# Patient Record
Sex: Male | Born: 1960 | Race: White | Hispanic: No | Marital: Married | Smoking: Never smoker
Health system: Southern US, Community
[De-identification: ages and names within clinical notes are randomized; demographics above are authoritative.]

## PROBLEM LIST (undated history)

## (undated) DIAGNOSIS — M109 Gout, unspecified: Secondary | ICD-10-CM

## (undated) DIAGNOSIS — I10 Essential (primary) hypertension: Secondary | ICD-10-CM

## (undated) HISTORY — DX: Gout, unspecified: M10.9

## (undated) HISTORY — DX: Essential (primary) hypertension: I10

---
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
## 2004-03-14 ENCOUNTER — Other Ambulatory Visit: Payer: Self-pay

## 2004-03-14 ENCOUNTER — Emergency Department: Payer: Self-pay | Admitting: Emergency Medicine

## 2005-01-25 ENCOUNTER — Emergency Department: Payer: Self-pay | Admitting: Emergency Medicine

## 2006-03-15 HISTORY — PX: FOOT SURGERY: SHX648

## 2009-11-12 ENCOUNTER — Ambulatory Visit: Payer: Self-pay | Admitting: Internal Medicine

## 2010-07-10 ENCOUNTER — Ambulatory Visit: Payer: Self-pay | Admitting: Internal Medicine

## 2011-05-11 ENCOUNTER — Ambulatory Visit: Payer: Self-pay | Admitting: General Surgery

## 2014-05-13 ENCOUNTER — Emergency Department: Payer: Self-pay | Admitting: Emergency Medicine

## 2014-05-24 DIAGNOSIS — I10 Essential (primary) hypertension: Secondary | ICD-10-CM | POA: Insufficient documentation

## 2015-12-02 DIAGNOSIS — R739 Hyperglycemia, unspecified: Secondary | ICD-10-CM | POA: Insufficient documentation

## 2016-02-25 ENCOUNTER — Ambulatory Visit: Payer: Self-pay | Admitting: Physician Assistant

## 2016-02-25 ENCOUNTER — Encounter: Payer: Self-pay | Admitting: Physician Assistant

## 2016-02-25 VITALS — BP 160/90 | HR 115 | Temp 98.6°F

## 2016-02-25 DIAGNOSIS — J029 Acute pharyngitis, unspecified: Secondary | ICD-10-CM

## 2016-02-25 DIAGNOSIS — R300 Dysuria: Secondary | ICD-10-CM

## 2016-02-25 LAB — POCT URINALYSIS DIPSTICK
Bilirubin, UA: NEGATIVE
Glucose, UA: NEGATIVE
Ketones, UA: NEGATIVE
Leukocytes, UA: NEGATIVE
Nitrite, UA: NEGATIVE
Protein, UA: NEGATIVE
Spec Grav, UA: 1.02
Urobilinogen, UA: 0.2
pH, UA: 5.5

## 2016-02-25 LAB — POCT RAPID STREP A (OFFICE): Rapid Strep A Screen: NEGATIVE

## 2016-02-25 MED ORDER — AZITHROMYCIN 250 MG PO TABS: ORAL_TABLET | ORAL | 0 refills | Status: DC

## 2016-02-25 NOTE — Progress Notes (Signed)
S: C/o sore throat, runny nose and congestion for 3 days, ? fever, chills, felt really hot last night, none this am, also some burning with urination last night, no cp/sob, v/d;  Using otc meds:   O: PE: vitals wnl nad,  perrl eomi, normocephalic, tms dull, nasal mucosa red and swollen, throat injected with exudate on left side; neck supple no lymph, lungs c t a, cv rrr, neuro intact, q strep neg, ua wnl  A:  Acute pharyngitis, dysuria   P: drink fluids, continue regular meds , use otc meds of choice, return if not improving in 5 days, return earlier if worsening , tylenol 1000mg   given in clinic, zpack,

## 2016-05-19 ENCOUNTER — Ambulatory Visit: Payer: Self-pay | Admitting: Family

## 2016-05-19 VITALS — BP 150/110 | HR 100 | Temp 101.0°F

## 2016-05-19 DIAGNOSIS — J069 Acute upper respiratory infection, unspecified: Secondary | ICD-10-CM

## 2016-05-19 DIAGNOSIS — J101 Influenza due to other identified influenza virus with other respiratory manifestations: Secondary | ICD-10-CM

## 2016-05-19 LAB — POCT INFLUENZA A/B
INFLUENZA A, POC: NEGATIVE
Influenza B, POC: POSITIVE — AB

## 2016-05-19 MED ORDER — OSELTAMIVIR PHOSPHATE 75 MG PO CAPS: 75.0000 mg | ORAL_CAPSULE | Freq: Two times a day (BID) | ORAL | 0 refills | Status: DC

## 2016-05-19 MED ORDER — AZITHROMYCIN 250 MG PO TABS: ORAL_TABLET | ORAL | 0 refills | Status: DC

## 2016-05-19 MED ORDER — HYDROCOD POLST-CPM POLST ER 10-8 MG/5ML PO SUER: 5.0000 mL | Freq: Two times a day (BID) | ORAL | 0 refills | Status: DC | PRN

## 2016-05-19 NOTE — Progress Notes (Signed)
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 zpack  rx tussionex 1 tsp q 12 h prn #100 orf. Supportive measures discussed. Follow up prn not improving   Hydration encouraged , tylenol alt with advil .Out of work until 3/12 /18 note given.

## 2017-04-26 ENCOUNTER — Ambulatory Visit: Payer: Self-pay | Admitting: Family Medicine

## 2017-04-26 VITALS — BP 140/99 | HR 108 | Temp 98.3°F | Resp 17

## 2017-04-26 DIAGNOSIS — R05 Cough: Secondary | ICD-10-CM

## 2017-04-26 DIAGNOSIS — J069 Acute upper respiratory infection, unspecified: Secondary | ICD-10-CM

## 2017-04-26 DIAGNOSIS — I10 Essential (primary) hypertension: Secondary | ICD-10-CM

## 2017-04-26 DIAGNOSIS — R6889 Other general symptoms and signs: Secondary | ICD-10-CM

## 2017-04-26 DIAGNOSIS — R509 Fever, unspecified: Secondary | ICD-10-CM

## 2017-04-26 DIAGNOSIS — R059 Cough, unspecified: Secondary | ICD-10-CM

## 2017-04-26 LAB — POCT INFLUENZA A/B
INFLUENZA A, POC: NEGATIVE
Influenza B, POC: NEGATIVE

## 2017-04-26 MED ORDER — FLUTICASONE PROPIONATE 50 MCG/ACT NA SUSP: 2.0000 | Freq: Every day | NASAL | 1 refills | Status: DC

## 2017-04-26 MED ORDER — BENZONATATE 100 MG PO CAPS: 100.0000 mg | ORAL_CAPSULE | Freq: Three times a day (TID) | ORAL | 0 refills | Status: DC | PRN

## 2017-04-26 NOTE — Progress Notes (Signed)
Patient ID: Shawn RegisterDwayne M Mullan, male    DOB: 11/19/1960  Age: 57 y.o. MRN: [id number]  Chief Complaint  Patient presents with  . URI  . 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  He has been getting ill for the last couple of days.  Sunday he was only mildly congested.  Yesterday got worse with more congestion, sinus and head pressure, and then developed a cough.  When you breathe the.  Cough.  When the evening came he developed some backache and leg aches.  He has had his influenza vaccine.  He does not smoke.  He generally is pretty healthy, on blood pressure medicine.  Usually his blood pressure is in good control.  Temperature just was just over 100 last night.  Current allergies, medications, problem list, past/family and social histories reviewed.  Objective:  BP (!) 140/99   Pulse (!) 108   Temp 98.3 F (36.8 C) (Oral)   Resp 17   SpO2 96%   Does not appear terribly ill though he is coughing some.  His TMs are normal.  Eyes not injected.  Throat minimally erythematous.  He does have some sore throat complaints.  Nose a little congested.  Chest is clear.  Heart regular without murmurs.  When he takes a deep breath does trigger some coughing.  His neck is supple without nodes.  Assessment & Plan:   Assessment: 1. Viral upper respiratory infection   2. Fever, unspecified   3. Flu-like symptoms   4. Essential hypertension   5. Cough       Plan: Influenza test is negative.  Will treat symptomatically.  Orders Placed This Encounter  Procedures  . POCT Influenza A/B (POC66)    No orders of the defined types were placed in this encounter.        Patient Instructions  Drink plenty of fluids and get enough rest  Advise not going to work until afebrile for 24 hours.  Take Tylenol 500 mg 2 pills 3 times daily and/or ibuprofen 200 mg 3 pills 3 times daily as needed for fever or aching.  Use fluticasone nose spray 2 sprays  each nostril twice daily for 3 days, then once daily to open up sinuses.  You can continue using your Nettie pot if necessary.  You can take Claritin or Allegra or Zyrtec (loratadine or fexofenadine or cetirizine) in order to try to dry up the secretions some.  Because of your elevated blood pressure I would discourage taking things with a decongestant in them and less you have check your blood pressures and they are back to running well.  Take the NyQuil or another over-the-counter cough medicine such as Mucinex DM or Robitussin-DM if needed for cough.  The DM is a little bit sedating, and you work.  Benzonatate cough pills can be taken 1 or 2 pills 3 times daily as needed for daytime cough when you need something that is nonsedating.  Antibiotics are not indicated at this time, as they will do nothing for a virus infection.  It needs to run its course.  If however it transitions from this to something worse you might need to be rechecked.  Sometimes you can get secondary bacterial infections.   Upper Respiratory Infection, Adult Most upper respiratory infections (URIs) are caused by a virus. A URI affects the nose, throat, and upper air passages. The most common type of URI is often called "the common  cold." Follow these instructions at home:  Take medicines only as told by your doctor.  Gargle warm saltwater or take cough drops to comfort your throat as told by your doctor.  Use a warm mist humidifier or inhale steam from a shower to increase air moisture. This may make it easier to breathe.  Drink enough fluid to keep your pee (urine) clear or pale yellow.  Eat soups and other clear broths.  Have a healthy diet.  Rest as needed.  Go back to work when your fever is gone or your doctor says it is okay. ? You may need to stay home longer to avoid giving your URI to others. ? You can also wear a face mask and wash your hands often to prevent spread of the virus.  Use your inhaler  more if you have asthma.  Do not use any tobacco products, including cigarettes, chewing tobacco, or electronic cigarettes. If you need help quitting, ask your doctor. Contact a doctor if:  You are getting worse, not better.  Your symptoms are not helped by medicine.  You have chills.  You are getting more short of breath.  You have brown or red mucus.  You have yellow or brown discharge from your nose.  You have pain in your face, especially when you bend forward.  You have a fever.  You have puffy (swollen) neck glands.  You have pain while swallowing.  You have white areas in the back of your throat. Get help right away if:  You have very bad or constant: ? Headache. ? Ear pain. ? Pain in your forehead, behind your eyes, and over your cheekbones (sinus pain). ? Chest pain.  You have long-lasting (chronic) lung disease and any of the following: ? Wheezing. ? Long-lasting cough. ? Coughing up blood. ? A change in your usual mucus.  You have a stiff neck.  You have changes in your: ? Vision. ? Hearing. ? Thinking. ? Mood. This information is not intended to replace advice given to you by your health care provider. Make sure you discuss any questions you have with your health care provider. Document Released: 08/18/2007 Document Revised: 11/02/2015 Document Reviewed: 06/06/2013 Elsevier Interactive Patient Education  [employer].      Return if symptoms worsen or fail to improve.   Ellery Tash, MD 04/26/2017

## 2017-04-26 NOTE — Patient Instructions (Addendum)
Drink plenty of fluids and get enough rest  Advise not going to work until afebrile for 24 hours.  Take Tylenol 500 mg 2 pills 3 times daily and/or ibuprofen 200 mg 3 pills 3 times daily as needed for fever or aching.  Use fluticasone nose spray 2 sprays each nostril twice daily for 3 days, then once daily to open up sinuses.  You can continue using your Nettie pot if necessary.  You can take Claritin or Allegra or Zyrtec (loratadine or fexofenadine or cetirizine) in order to try to dry up the secretions some.  Because of your elevated blood pressure I would discourage taking things with a decongestant in them and less you have check your blood pressures and they are back to running well.  Take the NyQuil or another over-the-counter cough medicine such as Mucinex DM or Robitussin-DM if needed for cough.  The DM is a little bit sedating, and you work.  Benzonatate cough pills can be taken 1 or 2 pills 3 times daily as needed for daytime cough when you need something that is nonsedating.  Antibiotics are not indicated at this time, as they will do nothing for a virus infection.  It needs to run its course.  If however it transitions from this to something worse you might need to be rechecked.  Sometimes you can get secondary bacterial infections.   Upper Respiratory Infection, Adult Most upper respiratory infections (URIs) are caused by a virus. A URI affects the nose, throat, and upper air passages. The most common type of URI is often called "the common cold." Follow these instructions at home:  Take medicines only as told by your doctor.  Gargle warm saltwater or take cough drops to comfort your throat as told by your doctor.  Use a warm mist humidifier or inhale steam from a shower to increase air moisture. This may make it easier to breathe.  Drink enough fluid to keep your pee (urine) clear or pale yellow.  Eat soups and other clear broths.  Have a healthy diet.  Rest as  needed.  Go back to work when your fever is gone or your doctor says it is okay. ? You may need to stay home longer to avoid giving your URI to others. ? You can also wear a face mask and wash your hands often to prevent spread of the virus.  Use your inhaler more if you have asthma.  Do not use any tobacco products, including cigarettes, chewing tobacco, or electronic cigarettes. If you need help quitting, ask your doctor. Contact a doctor if:  You are getting worse, not better.  Your symptoms are not helped by medicine.  You have chills.  You are getting more short of breath.  You have brown or red mucus.  You have yellow or brown discharge from your nose.  You have pain in your face, especially when you bend forward.  You have a fever.  You have puffy (swollen) neck glands.  You have pain while swallowing.  You have white areas in the back of your throat. Get help right away if:  You have very bad or constant: ? Headache. ? Ear pain. ? Pain in your forehead, behind your eyes, and over your cheekbones (sinus pain). ? Chest pain.  You have long-lasting (chronic) lung disease and any of the following: ? Wheezing. ? Long-lasting cough. ? Coughing up blood. ? A change in your usual mucus.  You have a stiff neck.  You have changes  in your: ? Vision. ? Hearing. ? Thinking. ? Mood. This information is not intended to replace advice given to you by your health care provider. Make sure you discuss any questions you have with your health care provider. Document Released: 08/18/2007 Document Revised: 11/02/2015 Document Reviewed: 06/06/2013 Elsevier Interactive Patient Education  2018 [employer].

## 2018-01-23 ENCOUNTER — Ambulatory Visit (INDEPENDENT_AMBULATORY_CARE_PROVIDER_SITE_OTHER): Payer: Managed Care, Other (non HMO) | Admitting: Urology

## 2018-01-23 ENCOUNTER — Encounter: Payer: Self-pay | Admitting: Urology

## 2018-01-23 VITALS — BP 154/100 | HR 81 | Ht 72.0 in | Wt 248.5 lb

## 2018-01-23 DIAGNOSIS — R972 Elevated prostate specific antigen [PSA]: Secondary | ICD-10-CM | POA: Diagnosis not present

## 2018-01-23 DIAGNOSIS — R300 Dysuria: Secondary | ICD-10-CM

## 2018-01-23 LAB — URINALYSIS, COMPLETE
Bilirubin, UA: NEGATIVE
GLUCOSE, UA: NEGATIVE
KETONES UA: NEGATIVE
LEUKOCYTES UA: NEGATIVE
NITRITE UA: NEGATIVE
Protein, UA: NEGATIVE
RBC UA: NEGATIVE
SPEC GRAV UA: 1.015 (ref 1.005–1.030)
UUROB: 0.2 mg/dL (ref 0.2–1.0)
pH, UA: 6.5 (ref 5.0–7.5)

## 2018-01-23 MED ORDER — TAMSULOSIN HCL 0.4 MG PO CAPS: 0.4000 mg | ORAL_CAPSULE | Freq: Every day | ORAL | 11 refills | Status: DC

## 2018-01-23 MED ORDER — SULFAMETHOXAZOLE-TRIMETHOPRIM 800-160 MG PO TABS: 1.0000 | ORAL_TABLET | Freq: Two times a day (BID) | ORAL | 0 refills | Status: DC

## 2018-01-23 NOTE — Progress Notes (Signed)
01/23/2018 4:46 PM   Shown Laqueta Due 1960/08/14 [id number]  Referring provider: Kandyce Rud, MD [phone] [address] [facility] [place], [place] [address]  CC: Elevated PSA, pelvic pain, dysuria  HPI: I had the pleasure of meeting Shawn Henry in urology clinic today in consultation for elevated PSA, pelvic pain, and dysuria from Dr. Larwance Sachs.  He is a very healthy 57 year old male with a family history of non-lethal prostate cancer in his grandfather, as well as fatal ovarian cancer at a young age and his sister who presents with an elevated PSA of 4.58.  PSA was 3.11-year previously.  He has never had a prostate biopsy before.  He also has had notable urinary symptoms over the last 4 to 6 weeks.  He reports some dysuria and weak stream, as well as pelvic pain and pressure.  PVR in clinic today was 118 cc.  He feels like his symptoms are actually worse when he is laying down at the end of the day.  There are no other aggravating factors.  He was prescribed a course of nitrofurantoin by his primary care physician that did seem to improve his symptoms significantly, however it has since returned.  He does have a history of prostatitis in the past.  He denies any bone pain or weight loss.  There is no prior urine culture data available to me.  He denies any gross hematuria or flank pain.   PMH: Past Medical History:  Diagnosis Date  . Gout   . HTN (hypertension)     Surgical History: Past Surgical History:  Procedure Laterality Date  . FOOT SURGERY  2008  . KNEE SURGERY  1980    Allergies:  Allergies  Allergen Reactions  . Penicillin G Potassium In D5w Rash    Family History: Family History  Problem Relation Age of Onset  . Prostate cancer Paternal Grandfather   . Kidney cancer Neg Hx   . Kidney failure Neg Hx   . Sickle cell anemia Neg Hx   . Tuberculosis Neg Hx     Social History:  reports that he has never smoked. He has never used  smokeless tobacco. His alcohol and drug histories are not on file.  ROS: Please see flowsheet from today's date for complete review of systems.  Physical Exam: BP (!) 154/100 (BP Location: Left Arm, Patient Position: Sitting, Cuff Size: Large)   Pulse 81   Ht 6' (1.829 m)   Wt 248 lb 8 oz (112.7 kg)   BMI 33.70 kg/m    Constitutional:  Alert and oriented, No acute distress. Cardiovascular: No clubbing, cyanosis, or edema. Respiratory: Normal respiratory effort, no increased work of breathing. GI: Abdomen is soft, nontender, nondistended, no abdominal masses GU: No CVA tenderness, phallus without lesions, widely patent meatus DRE: 20 g, smooth prostate, no nodules or masses Lymph: No cervical or inguinal lymphadenopathy. Skin: No rashes, bruises or suspicious lesions. Neurologic: Grossly intact, no focal deficits, moving all 4 extremities. Psychiatric: Normal mood and affect.  Laboratory Data: PSA history 12/08/2017: 4.58 11/2016: 3.07 11/2015: 2.56 05/2014: 2.28   Urinalysis today 0 WBCs, 0 RBCs, no bacteria, nitrite negative  Pertinent Imaging: None to review  Assessment & Plan:   In summary, Shawn Henry is a healthy 56 year old male with a family history of nonlethal prostate cancer in his grandfather who presents with mildly elevated PSA to 4.58 in conjunction with significant urinary symptoms and pelvic pain.  Though urinalysis is not concerning  today, he has been and on antibiotics recently.  Unclear if etiology of his mildly elevated PSA and symptoms as possible prostatitis, or if these are unrelated.  We reviewed the implications of an elevated PSA and the uncertainty surrounding it. In general, a man's PSA increases with age and is produced by both normal and cancerous prostate tissue. The differential diagnosis for elevated PSA includes BPH, prostate cancer, infection, recent intercourse/ejaculation, recent urethroscopic manipulation (foley placement/cystoscopy) or trauma,  and prostatitis.   Management of an elevated PSA can include observation or prostate biopsy and we discussed this in detail. Our goal is to detect clinically significant prostate cancers, and manage with either active surveillance, surgery, or radiation for localized disease. Risks of prostate biopsy include bleeding, infection (including life threatening sepsis), pain, and lower urinary symptoms. Hematuria, hematospermia, and blood in the stool are all common after biopsy and can persist up to 4 weeks.   Since his urinary symptoms are significant, I did recommend treatment for possible prostatitis and repeat PSA prior to undergoing a biopsy.  He will take a 2-week course of treatment Bactrim, as well as a week of anti-inflammatories for his pelvic pain, and follow-up in 4 weeks with a repeat PSA.  If PSA remains elevated, I do recommend proceeding with prostate biopsy at that time.   Sondra Come, MD  [facility] [address] [place], [place] [address] [phone]

## 2018-02-16 ENCOUNTER — Other Ambulatory Visit: Payer: Managed Care, Other (non HMO)

## 2018-02-16 DIAGNOSIS — R300 Dysuria: Secondary | ICD-10-CM

## 2018-02-17 LAB — PSA, TOTAL AND FREE
PROSTATE SPECIFIC AG, SERUM: 3.2 ng/mL (ref 0.0–4.0)
PSA, Free Pct: 16.6 %
PSA, Free: 0.53 ng/mL

## 2018-02-21 ENCOUNTER — Ambulatory Visit (INDEPENDENT_AMBULATORY_CARE_PROVIDER_SITE_OTHER): Payer: Managed Care, Other (non HMO) | Admitting: Urology

## 2018-02-21 ENCOUNTER — Encounter: Payer: Self-pay | Admitting: Urology

## 2018-02-21 VITALS — BP 151/89 | HR 87 | Ht 72.0 in | Wt 252.3 lb

## 2018-02-21 DIAGNOSIS — R300 Dysuria: Secondary | ICD-10-CM

## 2018-02-21 DIAGNOSIS — Z125 Encounter for screening for malignant neoplasm of prostate: Secondary | ICD-10-CM

## 2018-02-21 NOTE — Progress Notes (Signed)
   02/21/2018 4:36 PM   Izaiyah Laqueta DueM Rideaux 12/09/1960 [id number]  Reason for visit: Follow up elevated PSA and pelvic pain/prostatitis  I saw Mr. Ninetta LightsJaney back in urology clinic for discussion of his prior elevated PSA and pelvic pain/prostatitis.  He is a 57 year old healthy man I originally saw 01/23/2018 for an elevated PSA of 4.58.  He has a family history of nonlethal prostate cancer in his grandfather.  At the time of his elevated PSA he was having dysuria and some low pelvic pain behind the scrotum that he attributed to possible prostatitis.  He had taken a short course of nitrofurantoin from his PCP that improved his symptoms temporarily.  There were no documented positive cultures in the chart.  At that time we had elected to treat presumed prostatitis with a prolonged course of Bactrim, as well as 2 weeks of scheduled anti-inflammatories for his pelvic pain.  His repeat PSA was 3.2 which is stable from 3.1 in 2018.  He does feel that the course of antibiotics and anti-inflammatories have significantly improved his dysuria and urinary symptoms, as well as his pelvic pain.  I suspect he had a smoldering prostatitis that causes elevated PSA and dysuria and pelvic symptoms, now resolved after worse of antibiotics and anti-inflammatories.  We discussed the AUA guidelines that recommend PSA screening for men up to age 770.  RTC in 1 year for PSA/DRE, sooner if recurrent urinary/pelvic symptoms  A total of 15 minutes were spent face-to-face with the patient, greater than 50% was spent in patient education, counseling, and coordination of care regarding PSA screening, prostatitis, and dysuria.   Sondra ComeBrian C Sninsky, MD  [facility] [address], [address] [place], [place] [address] [phone]

## 2018-02-22 LAB — URINALYSIS, COMPLETE
Bilirubin, UA: NEGATIVE
Glucose, UA: NEGATIVE
Ketones, UA: NEGATIVE
Nitrite, UA: NEGATIVE
Protein, UA: NEGATIVE
RBC, UA: NEGATIVE
Specific Gravity, UA: 1.015 (ref 1.005–1.030)
Urobilinogen, Ur: 0.2 mg/dL (ref 0.2–1.0)
pH, UA: 7.5 (ref 5.0–7.5)

## 2018-02-22 LAB — MICROSCOPIC EXAMINATION
Bacteria, UA: NONE SEEN
Epithelial Cells (non renal): NONE SEEN /HPF (ref 0–10)
RBC, UA: NONE SEEN /HPF (ref 0–2)

## 2018-08-27 ENCOUNTER — Emergency Department: Payer: Managed Care, Other (non HMO)

## 2018-08-27 ENCOUNTER — Other Ambulatory Visit: Payer: Self-pay

## 2018-08-27 DIAGNOSIS — K5900 Constipation, unspecified: Secondary | ICD-10-CM | POA: Insufficient documentation

## 2018-08-27 DIAGNOSIS — Z79899 Other long term (current) drug therapy: Secondary | ICD-10-CM | POA: Diagnosis not present

## 2018-08-27 DIAGNOSIS — I10 Essential (primary) hypertension: Secondary | ICD-10-CM | POA: Diagnosis not present

## 2018-08-27 DIAGNOSIS — R1013 Epigastric pain: Secondary | ICD-10-CM | POA: Diagnosis present

## 2018-08-27 DIAGNOSIS — Z7982 Long term (current) use of aspirin: Secondary | ICD-10-CM | POA: Diagnosis not present

## 2018-08-27 LAB — TROPONIN I: Troponin I: <0.03 ng/mL (ref <0.03)

## 2018-08-27 LAB — COMPREHENSIVE METABOLIC PANEL
ALT: 28 U/L (ref 0–44)
AST: 20 U/L (ref 15–41)
Albumin: 4.5 g/dL (ref 3.5–5.0)
Alkaline Phosphatase: 61 U/L (ref 38–126)
Anion gap: 11 (ref 5–15)
BUN: 14 mg/dL (ref 6–20)
CO2: 28 mmol/L (ref 22–32)
Calcium: 10 mg/dL (ref 8.9–10.3)
Chloride: 102 mmol/L (ref 98–111)
Creatinine, Ser: 1.04 mg/dL (ref 0.61–1.24)
GFR calc Af Amer: >60 mL/min (ref >60)
GFR calc non Af Amer: >60 mL/min (ref >60)
Glucose, Bld: 123 mg/dL — ABNORMAL HIGH (ref 70–99)
Potassium: 3.7 mmol/L (ref 3.5–5.1)
Sodium: 141 mmol/L (ref 135–145)
Total Bilirubin: 0.4 mg/dL (ref 0.3–1.2)
Total Protein: 7.3 g/dL (ref 6.5–8.1)

## 2018-08-27 LAB — CBC
HCT: 46.3 % (ref 39.0–52.0)
Hemoglobin: 15.2 g/dL (ref 13.0–17.0)
MCH: 27.1 pg (ref 26.0–34.0)
MCHC: 32.8 g/dL (ref 30.0–36.0)
MCV: 82.7 fL (ref 80.0–100.0)
Platelets: 271 10*3/uL (ref 150–400)
RBC: 5.6 MIL/uL (ref 4.22–5.81)
RDW: 13.3 % (ref 11.5–15.5)
WBC: 9.2 10*3/uL (ref 4.0–10.5)
nRBC: 0 % (ref 0.0–0.2)

## 2018-08-27 LAB — LIPASE, BLOOD: Lipase: 35 U/L (ref 11–51)

## 2018-08-27 NOTE — ED Triage Notes (Signed)
Pt with central chest to epigastric pain since 1700. Pt states has had some back pain, but denies nausea, vomiting, shob, diaphoresis, lightheadedness or dizziness. Pt appears in no acute distress.

## 2018-08-28 ENCOUNTER — Emergency Department: Payer: Managed Care, Other (non HMO)

## 2018-08-28 ENCOUNTER — Emergency Department
Admission: EM | Admit: 2018-08-28 | Discharge: 2018-08-28 | Disposition: A | Discharge status: Home / Self Care | Payer: Managed Care, Other (non HMO) | Attending: Emergency Medicine | Admitting: Emergency Medicine

## 2018-08-28 DIAGNOSIS — R1013 Epigastric pain: Secondary | ICD-10-CM

## 2018-08-28 DIAGNOSIS — K5900 Constipation, unspecified: Secondary | ICD-10-CM

## 2018-08-28 LAB — TROPONIN I: Troponin I: <0.03 ng/mL (ref <0.03)

## 2018-08-28 LAB — URINALYSIS, COMPLETE (UACMP) WITH MICROSCOPIC
Bilirubin Urine: NEGATIVE
Glucose, UA: NEGATIVE mg/dL
Hgb urine dipstick: NEGATIVE
Ketones, ur: NEGATIVE mg/dL
Leukocytes,Ua: NEGATIVE
Nitrite: NEGATIVE
Protein, ur: NEGATIVE mg/dL
Specific Gravity, Urine: 1.019 (ref 1.005–1.030)
Squamous Epithelial / HPF: NONE SEEN (ref 0–5)
pH: 5 (ref 5.0–8.0)

## 2018-08-28 MED ORDER — LACTULOSE 10 GM/15ML PO SOLN
30.0000 g | Freq: Once | ORAL | Status: AC
  Administered 2018-08-28: 30 g via ORAL
  Filled 2018-08-28: qty 60

## 2018-08-28 MED ORDER — SODIUM CHLORIDE 0.9 % IV BOLUS
500.0000 mL | Freq: Once | INTRAVENOUS | Status: AC
  Administered 2018-08-28: 500 mL via INTRAVENOUS

## 2018-08-28 MED ORDER — ONDANSETRON HCL 4 MG/2ML IJ SOLN
4.0000 mg | Freq: Once | INTRAMUSCULAR | Status: AC
  Administered 2018-08-28: 4 mg via INTRAVENOUS
  Filled 2018-08-28: qty 2

## 2018-08-28 MED ORDER — LACTULOSE 10 GM/15ML PO SOLN: 20.0000 g | Freq: Every day | ORAL | 0 refills | Status: DC | PRN

## 2018-08-28 MED ORDER — DICYCLOMINE HCL 20 MG PO TABS: 20.0000 mg | ORAL_TABLET | Freq: Four times a day (QID) | ORAL | 0 refills | Status: DC | PRN

## 2018-08-28 MED ORDER — FAMOTIDINE IN NACL 20-0.9 MG/50ML-% IV SOLN
20.0000 mg | Freq: Once | INTRAVENOUS | Status: AC
  Administered 2018-08-28: 20 mg via INTRAVENOUS
  Filled 2018-08-28: qty 50

## 2018-08-28 MED ORDER — FENTANYL CITRATE (PF) 100 MCG/2ML IJ SOLN
50.0000 ug | Freq: Once | INTRAMUSCULAR | Status: AC
  Administered 2018-08-28: 50 ug via INTRAVENOUS
  Filled 2018-08-28: qty 2

## 2018-08-28 NOTE — ED Notes (Signed)
Peripheral IV discontinued. Catheter intact. No signs of infiltration or redness. Gauze applied to IV site.   Discharge instructions reviewed with patient. Questions fielded by this RN. Patient verbalizes understanding of instructions. Patient discharged home in stable condition per sung. No acute distress noted at time of discharge.    

## 2018-08-28 NOTE — Discharge Instructions (Addendum)
1.  You may take Lactulose as needed for bowel movements. 2.  You may take Bentyl as needed for abdominal discomfort. 3.  Take the following over-the-counter medicines to regulate bowel movements daily: MiraLAX Stool softener such as Colace Fiber 4.  Drink plenty of fluids daily. 5.  Return to the ER for worsening symptoms, persistent vomiting, difficulty breathing or other concerns.

## 2018-08-28 NOTE — ED Provider Notes (Signed)
Shawn Henry   ____________________________________________   First MD Initiated Contact with Patient 08/28/18 0201     (approximate)  I have reviewed the triage vital signs and the nursing notes.   HISTORY  Chief Complaint Chest Pain    HPI Shawn Henry is a 58 y.o. male who presents to the ED from home with a chief complaint of epigastric/lower chest pain.  Patient reports onset of pressure type pain since 5 PM.  Ate steak and baked potatoes for lunch prior to onset of pain.  Endorses some radiation to his back.  Denies associated nausea or vomiting.  Denies recent fever, cough, shortness of breath, dysuria or diarrhea.  Denies recent travel, trauma or exposure to persons diagnosed with coronavirus.       Past Medical History:  Diagnosis Date  . Gout   . HTN (hypertension)     Patient Active Problem List   Diagnosis Date Noted  . Elevated blood sugar 12/02/2015  . Benign essential hypertension 05/24/2014    Past Surgical History:  Procedure Laterality Date  . FOOT SURGERY  2008  . [place]    Prior to Admission medications   Medication Sig Start Date End Date Taking? Authorizing Provider  aspirin EC 81 MG tablet Take 81 mg by mouth daily.    [provider]  dicyclomine (BENTYL) 20 MG tablet Take 1 tablet (20 mg total) by mouth every 6 (six) hours as needed. 08/28/18   Paulette Blanch, MD  fluticasone (FLONASE) 50 MCG/ACT nasal spray Place 2 sprays into both nostrils daily. 04/26/17   Posey Boyer, MD  lactulose (CHRONULAC) 10 GM/15ML solution Take 30 mLs (20 g total) by mouth daily as needed for mild constipation. 08/28/18   Paulette Blanch, MD  losartan-hydrochlorothiazide (HYZAAR) 50-12.5 MG tablet TAKE 1 TABLET BY MOUTH ONCE DAILY. 11/18/15   [provider]  Multiple Vitamin (MULTI-VITAMINS) TABS Take by mouth.    [provider]  sulfamethoxazole-trimethoprim (BACTRIM  DS,SEPTRA DS) 800-160 MG tablet Take 1 tablet by mouth 2 (two) times daily. 01/23/18   Billey Co, MD  tamsulosin (FLOMAX) 0.4 MG CAPS capsule Take 1 capsule (0.4 mg total) by mouth daily. Patient not taking: Reported on 02/21/2018 01/23/18   Billey Co, MD    Allergies Penicillin g potassium in d5w  Family History  Problem Relation Age of Onset  . Prostate cancer Paternal Grandfather   . Kidney cancer Neg Hx   . Kidney failure Neg Hx   . Sickle cell anemia Neg Hx   . Tuberculosis Neg Hx     Social History Social History   Tobacco Use  . Smoking status: Never Smoker  . Smokeless tobacco: Never Used  Substance Use Topics  . Alcohol use: Not on file  . Drug use: Not on file    Review of Systems  Constitutional: No fever/chills Eyes: No visual changes. ENT: No sore throat. Cardiovascular: Denies chest pain. Respiratory: Denies shortness of breath. Gastrointestinal: No abdominal pain.  No nausea, no vomiting.  No diarrhea.  No constipation. Genitourinary: Negative for dysuria. Musculoskeletal: Negative for back pain. Skin: Negative for rash. Neurological: Negative for headaches, focal weakness or numbness.   ____________________________________________   PHYSICAL EXAM:  VITAL SIGNS: ED Triage Vitals  Enc Vitals Group     BP 08/27/18 2222 (!) 180/102     Pulse Rate 08/27/18 2222 78     Resp 08/27/18 2222 18  Temp 08/27/18 2222 98 F (36.7 C)     Temp Source 08/27/18 2222 Oral     SpO2 --      Weight 08/27/18 2223 250 lb (113.4 kg)     Height 08/27/18 2223 6' (1.829 m)     Head Circumference --      Peak Flow --      Pain Score 08/27/18 2223 5     Pain Loc --      Pain Edu? --      Excl. in GC? --     Constitutional: Alert and oriented. Well appearing and in no acute distress. Eyes: Conjunctivae are normal. PERRL. EOMI. Head: Atraumatic. Nose: No congestion/rhinnorhea. Mouth/Throat: Mucous membranes are moist.  Oropharynx  non-erythematous. Neck: No stridor.   Cardiovascular: Normal rate, regular rhythm. Grossly normal heart sounds.  Good peripheral circulation. Respiratory: Normal respiratory effort.  No retractions. Lungs CTAB. Gastrointestinal: Soft and mildly tender to palpation epigastrium without rebound or guarding. No distention. No abdominal bruits. No CVA tenderness. Musculoskeletal: No lower extremity tenderness nor edema.  No joint effusions. Neurologic:  Normal speech and language. No gross focal neurologic deficits are appreciated. No gait instability. Skin:  Skin is warm, dry and intact. No rash noted. Psychiatric: Mood and affect are normal. Speech and behavior are normal.  ____________________________________________   LABS (all labs ordered are listed, but only abnormal results are displayed)  Labs Reviewed  COMPREHENSIVE METABOLIC PANEL - Abnormal; Notable for the following components:      Result Value   Glucose, Bld 123 (*)    All other components within normal limits  URINALYSIS, COMPLETE (UACMP) WITH MICROSCOPIC - Abnormal; Notable for the following components:   Color, Urine YELLOW (*)    APPearance CLEAR (*)    Bacteria, UA RARE (*)    All other components within normal limits  CBC  TROPONIN I  LIPASE, BLOOD  TROPONIN I   ____________________________________________  EKG  ED ECG REPORT I, Zonnique Norkus J, the attending physician, personally viewed and interpreted this ECG.   Date: 08/28/2018  EKG Time: 2215  Rate: 78  Rhythm: normal EKG, normal sinus rhythm  Axis: Normal  Intervals:none  ST&T Change: Nonspecific  ____________________________________________  RADIOLOGY  ED MD interpretation: No acute cardiopulmonary process; no gallstones; moderate stool burden on KUB  Official radiology report(s): Dg Chest 2 View  Result Date: 08/27/2018 CLINICAL DATA:  58 y/o  M; chest pain. EXAM: CHEST - 2 VIEW COMPARISON:  11/12/2009 CT chest. FINDINGS: The heart size and  mediastinal contours are within normal limits. Both lungs are clear. The visualized skeletal structures are unremarkable. IMPRESSION: No active cardiopulmonary disease. Electronically Signed   By: Mitzi HansenLance  Furusawa-Stratton M.D.   On: 08/27/2018 23:05   Dg Abdomen 1 View  Result Date: 08/28/2018 CLINICAL DATA:  Epigastric abdominal pain. EXAM: ABDOMEN - 1 VIEW COMPARISON:  None. FINDINGS: No evidence of free air on supine views. No bowel dilatation to suggest obstruction. Moderate stool in the ascending and transverse colon, small volume of stool distally. No abnormal soft tissue calcifications. No acute osseous abnormalities are seen. IMPRESSION: Normal bowel gas pattern. Small to moderate colonic stool burden. Electronically Signed   By: Narda RutherfordMelanie  Sanford M.D.   On: 08/28/2018 03:20   [place] Abdomen Limited Ruq  Result Date: 08/28/2018 CLINICAL DATA:  Epigastric abdominal pain. EXAM: ULTRASOUND ABDOMEN LIMITED RIGHT UPPER QUADRANT COMPARISON:  None. FINDINGS: Gallbladder: Physiologically distended. No gallstones or wall thickening visualized. No sonographic Murphy sign noted by sonographer. Common bile  duct: Diameter: 3 mm, normal. Liver: No focal lesion identified. Within normal limits in parenchymal echogenicity. Portal vein is patent on color Doppler imaging with normal direction of blood flow towards the liver. IMPRESSION: Negative right upper quadrant ultrasound.  No gallstones. Electronically Signed   By: Narda RutherfordMelanie  Sanford M.D.   On: 08/28/2018 02:35    ____________________________________________   PROCEDURES  Procedure(s) performed (including Critical Care):  Procedures   ____________________________________________   INITIAL IMPRESSION / ASSESSMENT AND PLAN / ED COURSE  As part of my medical decision making, I reviewed the following data within the electronic MEDICAL RECORD NUMBER Nursing notes reviewed and incorporated, Labs reviewed, EKG interpreted, Old chart reviewed, Radiograph reviewed  and Notes from prior ED visits     Shawn Henry was evaluated in Emergency Department on 08/28/2018 for the symptoms described in the history of present illness. He was evaluated in the context of the global COVID-19 pandemic, which necessitated consideration that the patient might be at risk for infection with the SARS-CoV-2 virus that causes COVID-19. Institutional protocols and algorithms that pertain to the evaluation of patients at risk for COVID-19 are in a state of rapid change based on information released by regulatory bodies including the CDC and federal and state organizations. These policies and algorithms were followed during the patient's care in the ED.   58 year old male who presents with epigastric/substernal chest pain. Differential diagnosis includes, but is not limited to, biliary disease (biliary colic, acute cholecystitis, cholangitis, choledocholithiasis, etc), intrathoracic causes for epigastric abdominal pain including ACS, gastritis, duodenitis, pancreatitis, small bowel or large bowel obstruction, abdominal aortic aneurysm, hernia, and ulcer(s).  Initial EKG, LFTs, lipase and troponin unremarkable.  Patient took ibuprofen prior to arrival and states pain has decreased from 8/10 to 5/10.  Will repeat timed troponin and obtain ultrasound.  IV fentanyl and Pepcid administered for pain.  Patient mentions he was recently treated for UTI; will check UA.   Clinical Course as of Aug 28 431  Mon Aug 28, 2018  0430 Patient feeling significantly better.  Updated him on all test results.  Will discharge home with prescription for Lactulose and Bentyl to use as needed.  Strict return precautions given.  Patient verbalizes understanding agrees with plan of care.   [JS]    Clinical Course User Index [JS] Irean HongSung, Caroll Weinheimer J, MD     ____________________________________________   FINAL CLINICAL IMPRESSION(S) / ED DIAGNOSES  Final diagnoses:  Epigastric pain  Constipation, unspecified  constipation type     ED Discharge Orders         Ordered    dicyclomine (BENTYL) 20 MG tablet  Every 6 hours PRN     08/28/18 0431    lactulose (CHRONULAC) 10 GM/15ML solution  Daily PRN     08/28/18 0431           Henry:  This document was prepared using Dragon voice recognition software and may include unintentional dictation errors.   Irean HongSung, Genese Quebedeaux J, MD 08/28/18 [phone]

## 2018-08-28 NOTE — ED Notes (Addendum)
Pt c/o of of sharp epigastric pain 8/10 that shoots down at 2100 (pt denies eating of hx of GERD), pt took IBU and pain has lowered to 5/10  Pt denies abdominal surg, reports pain relieved by lying down  Pt reports wife in car, called att

## 2018-08-28 NOTE — ED Notes (Signed)
ED Provider at bedside. 

## 2019-02-12 ENCOUNTER — Other Ambulatory Visit: Payer: Self-pay

## 2019-02-12 ENCOUNTER — Other Ambulatory Visit: Payer: Managed Care, Other (non HMO)

## 2019-02-12 DIAGNOSIS — Z125 Encounter for screening for malignant neoplasm of prostate: Secondary | ICD-10-CM

## 2019-02-13 LAB — PSA TOTAL (REFLEX TO FREE): Prostate Specific Ag, Serum: 3.9 ng/mL (ref 0.0–4.0)

## 2019-02-14 ENCOUNTER — Ambulatory Visit: Payer: Managed Care, Other (non HMO) | Admitting: Urology

## 2019-02-21 ENCOUNTER — Other Ambulatory Visit: Payer: Self-pay

## 2019-02-21 ENCOUNTER — Encounter: Payer: Self-pay | Admitting: Urology

## 2019-02-21 ENCOUNTER — Ambulatory Visit (INDEPENDENT_AMBULATORY_CARE_PROVIDER_SITE_OTHER): Payer: Managed Care, Other (non HMO) | Admitting: Urology

## 2019-02-21 ENCOUNTER — Ambulatory Visit: Payer: Managed Care, Other (non HMO) | Admitting: Urology

## 2019-02-21 VITALS — BP 117/84 | HR 101 | Ht 73.0 in | Wt 250.0 lb

## 2019-02-21 DIAGNOSIS — Z125 Encounter for screening for malignant neoplasm of prostate: Secondary | ICD-10-CM

## 2019-02-21 DIAGNOSIS — R972 Elevated prostate specific antigen [PSA]: Secondary | ICD-10-CM | POA: Diagnosis not present

## 2019-02-21 DIAGNOSIS — R102 Pelvic and perineal pain unspecified side: Secondary | ICD-10-CM

## 2019-02-21 NOTE — Patient Instructions (Addendum)

## 2019-02-21 NOTE — Progress Notes (Signed)
   02/21/2019 4:13 PM   Shawn Henry 1960-09-29 [id number]  Reason for visit: Follow up PSA screening, history of pelvic pain/prostatitis  HPI: I saw Mr. [name] back in urology clinic for the above issues.  He is a 58 year old healthy man who I originally saw in November 2019 for a mildly elevated PSA of 4.6.  He has a family history of nonlethal prostate cancer in his grandfather.  He also had some urinary symptoms at the time of the mildly elevated PSA, and ultimately his urinary symptoms resolved with a course of Bactrim and anti-inflammatories, and his PSA returned to normal at 3.2.  Over the last year he denies any significant problems and is doing well.  He occasionally will have some very mild and subtle pelvic discomfort, but this is minimally bothersome to him.  He denies any urinary complaints or gross hematuria.  He feels NSAIDs alleviate his pelvic pain if it ever worsens.  PSA is stable at 3.9 on 02/12/2019 from 3.2 last year.  Patient deferred DRE this year.  We again reviewed the AUA guidelines that recommend yearly PSA screening for men age 46-69, as well as the risks and benefits of screening.  Regarding his pelvic pain, we discussed possible etiologies and conservative treatments.  RTC 1 year with PSA prior  A total of 25 minutes were spent face-to-face with the patient, greater than 50% was spent in patient education, counseling, and coordination of care regarding PSA screening and pelvic pain.  Billey Co, [facility] [address], [place], [place] [address] [phone]

## 2019-06-28 DIAGNOSIS — R972 Elevated prostate specific antigen [PSA]: Secondary | ICD-10-CM | POA: Insufficient documentation

## 2019-06-28 DIAGNOSIS — E7800 Pure hypercholesterolemia, unspecified: Secondary | ICD-10-CM | POA: Insufficient documentation

## 2019-07-19 ENCOUNTER — Other Ambulatory Visit: Payer: Self-pay | Admitting: Unknown Physician Specialty

## 2019-07-19 DIAGNOSIS — R519 Headache, unspecified: Secondary | ICD-10-CM

## 2019-07-31 ENCOUNTER — Ambulatory Visit
Admission: RE | Admit: 2019-07-31 | Discharge: 2019-07-31 | Disposition: A | Discharge status: Home / Self Care | Payer: Managed Care, Other (non HMO) | Source: Ambulatory Visit | Attending: Unknown Physician Specialty | Admitting: Unknown Physician Specialty

## 2019-07-31 ENCOUNTER — Other Ambulatory Visit: Payer: Self-pay

## 2019-07-31 DIAGNOSIS — R519 Headache, unspecified: Secondary | ICD-10-CM | POA: Diagnosis not present

## 2019-07-31 MED ORDER — GADOBUTROL 1 MMOL/ML IV SOLN
10.0000 mL | Freq: Once | INTRAVENOUS | Status: AC | PRN
  Administered 2019-07-31: 10 mL via INTRAVENOUS

## 2020-02-04 ENCOUNTER — Other Ambulatory Visit: Payer: Self-pay | Admitting: Family Medicine

## 2020-02-04 DIAGNOSIS — R972 Elevated prostate specific antigen [PSA]: Secondary | ICD-10-CM

## 2020-02-20 ENCOUNTER — Other Ambulatory Visit: Payer: Managed Care, Other (non HMO)

## 2020-02-27 ENCOUNTER — Ambulatory Visit: Payer: Managed Care, Other (non HMO) | Admitting: Urology

## 2020-04-30 ENCOUNTER — Other Ambulatory Visit: Payer: Self-pay

## 2020-04-30 ENCOUNTER — Ambulatory Visit (INDEPENDENT_AMBULATORY_CARE_PROVIDER_SITE_OTHER): Payer: Managed Care, Other (non HMO)

## 2020-04-30 ENCOUNTER — Ambulatory Visit (INDEPENDENT_AMBULATORY_CARE_PROVIDER_SITE_OTHER): Payer: Managed Care, Other (non HMO) | Admitting: Podiatry

## 2020-04-30 ENCOUNTER — Encounter: Payer: Self-pay | Admitting: Podiatry

## 2020-04-30 DIAGNOSIS — M722 Plantar fascial fibromatosis: Secondary | ICD-10-CM

## 2020-04-30 MED ORDER — TRIAMCINOLONE ACETONIDE 40 MG/ML IJ SUSP
20.0000 mg | Freq: Once | INTRAMUSCULAR | Status: AC
  Administered 2020-04-30: 20 mg

## 2020-04-30 MED ORDER — MELOXICAM 15 MG PO TABS: 15.0000 mg | ORAL_TABLET | Freq: Every day | ORAL | 3 refills | Status: DC

## 2020-04-30 MED ORDER — METHYLPREDNISOLONE 4 MG PO TBPK: ORAL_TABLET | ORAL | 0 refills | Status: DC

## 2020-04-30 NOTE — Progress Notes (Signed)
  Subjective:  Patient ID: Shawn Henry, male    DOB: [birth year],  MRN: [id number] HPI Chief Complaint  Patient presents with  . Foot Pain    Plantar heel right - aching x 2-3 months, AM pain, tried OTC insoles, PF socks, and ankle brace  . New Patient (Initial Visit)    60 y.o. male presents with the above complaint.   ROS: Denies fever chills nausea vomiting muscle aches pains calf pain back pain chest pain shortness of breath.  Past Medical History:  Diagnosis Date  . Gout   . HTN (hypertension)    Past Surgical History:  Procedure Laterality Date  . FOOT SURGERY  2008  . KNEE SURGERY  1980    Current Outpatient Medications:  .  meloxicam (MOBIC) 15 MG tablet, Take 1 tablet (15 mg total) by mouth daily., Disp: 30 tablet, Rfl: 3 .  methylPREDNISolone (MEDROL DOSEPAK) 4 MG TBPK tablet, 6 day dose pack - take as directed, Disp: 21 tablet, Rfl: 0 .  OVER THE COUNTER MEDICATION, "Gout Out", Disp: , Rfl:  .  aspirin EC 81 MG tablet, Take 81 mg by mouth daily., Disp: , Rfl:  .  Multiple Vitamin (MULTI-VITAMINS) TABS, Take by mouth., Disp: , Rfl:  .  olmesartan-hydrochlorothiazide (BENICAR HCT) 20-12.5 MG tablet, Take 1 tablet by mouth daily., Disp: , Rfl:   Allergies  Allergen Reactions  . Penicillin G Potassium In D5w Rash   Review of Systems Objective:  There were no vitals filed for this visit.  General: Well developed, nourished, in no acute distress, alert and oriented x3   Dermatological: Skin is warm, dry and supple bilateral. Nails x 10 are well maintained; remaining integument appears unremarkable at this time. There are no open sores, no preulcerative lesions, no rash or signs of infection present.  Vascular: Dorsalis Pedis artery and Posterior Tibial artery pedal pulses are 2/4 bilateral with immedate capillary fill time. Pedal hair growth present. No varicosities and no lower extremity edema present bilateral.   Neruologic: Grossly intact via light touch  bilateral. Vibratory intact via tuning fork bilateral. Protective threshold with Semmes Wienstein monofilament intact to all pedal sites bilateral. Patellar and Achilles deep tendon reflexes 2+ bilateral. No Babinski or clonus noted bilateral.   Musculoskeletal: No gross boney pedal deformities bilateral. No pain, crepitus, or limitation noted with foot and ankle range of motion bilateral. Muscular strength 5/5 in all groups tested bilateral.  Pain on palpation medial calcaneal tubercle.  No pain on medial lateral compression of the calcaneus.  Gait: Unassisted, Nonantalgic.    Radiographs:  Radiographs taken today demonstrate an osseously mature individual soft tissue increase in density plantar fascial pain insertion site.  Assessment & Plan:   Assessment: Planter fasciitis.  Plan: Injected the heel today 20 mg Kenalog 5 mg Marcaine point of maximal tenderness right.  Placed him in his braces that he has at home including a night brace and a plantar fascial brace.  I want him also to continue to wear appropriate shoe gear with his inserts.  I also started him on methylprednisolone to be followed by meloxicam.  I will follow-up with him in 1 month.     Denton Derks T. [place], [place]

## 2020-04-30 NOTE — Patient Instructions (Signed)

## 2020-07-02 ENCOUNTER — Encounter: Payer: Self-pay | Admitting: Podiatry

## 2020-07-02 ENCOUNTER — Other Ambulatory Visit: Payer: Self-pay

## 2020-07-02 ENCOUNTER — Ambulatory Visit (INDEPENDENT_AMBULATORY_CARE_PROVIDER_SITE_OTHER): Payer: Managed Care, Other (non HMO) | Admitting: Podiatry

## 2020-07-02 DIAGNOSIS — M722 Plantar fascial fibromatosis: Secondary | ICD-10-CM

## 2020-07-02 MED ORDER — CELECOXIB 100 MG PO CAPS: 100.0000 mg | ORAL_CAPSULE | Freq: Two times a day (BID) | ORAL | 3 refills | Status: DC

## 2020-07-02 MED ORDER — BETAMETHASONE SOD PHOS & ACET 6 (3-3) MG/ML IJ SUSP
3.0000 mg | Freq: Once | INTRAMUSCULAR | Status: AC
  Administered 2020-07-02: 3 mg

## 2020-07-02 NOTE — Progress Notes (Signed)
He presents today for follow-up of his right heel pain.  States that his back and had reached almost 100% better initially but has regressed back to 0.  Objective: Vital signs stable alert oriented x3 status pain palpation MucoClear tubercle of the right heel central calcaneal tubercle of the right heel.  Assessment: Planter fasciitis chronic right.  Plan: At this point we injected Celestone and change his meloxicam to Celebrex 100 mg 1 p.o. twice daily if he does not improve with this were going to set him up for orthotics.  May need to consider MRI as well.

## 2020-07-23 ENCOUNTER — Other Ambulatory Visit: Payer: Managed Care, Other (non HMO)

## 2020-07-30 ENCOUNTER — Ambulatory Visit: Payer: Managed Care, Other (non HMO) | Admitting: Podiatry

## 2021-08-25 ENCOUNTER — Other Ambulatory Visit
Admission: RE | Admit: 2021-08-25 | Discharge: 2021-08-25 | Disposition: A | Discharge status: Home / Self Care | Payer: BC Managed Care – PPO | Attending: Urology | Admitting: Urology

## 2021-08-25 ENCOUNTER — Ambulatory Visit: Payer: BC Managed Care – PPO | Admitting: Urology

## 2021-08-25 ENCOUNTER — Other Ambulatory Visit: Payer: Self-pay | Admitting: *Deleted

## 2021-08-25 ENCOUNTER — Encounter: Payer: Self-pay | Admitting: Urology

## 2021-08-25 VITALS — BP 165/97 | HR 86 | Ht 71.0 in | Wt 251.0 lb

## 2021-08-25 DIAGNOSIS — R972 Elevated prostate specific antigen [PSA]: Secondary | ICD-10-CM | POA: Diagnosis not present

## 2021-08-25 DIAGNOSIS — Z125 Encounter for screening for malignant neoplasm of prostate: Secondary | ICD-10-CM

## 2021-08-25 DIAGNOSIS — N410 Acute prostatitis: Secondary | ICD-10-CM

## 2021-08-25 DIAGNOSIS — N419 Inflammatory disease of prostate, unspecified: Secondary | ICD-10-CM | POA: Diagnosis not present

## 2021-08-25 LAB — URINALYSIS, COMPLETE (UACMP) WITH MICROSCOPIC
Bacteria, UA: NONE SEEN
Bilirubin Urine: NEGATIVE
Glucose, UA: NEGATIVE mg/dL
Hgb urine dipstick: NEGATIVE
Ketones, ur: NEGATIVE mg/dL
Leukocytes,Ua: NEGATIVE
Nitrite: NEGATIVE
Protein, ur: NEGATIVE mg/dL
Specific Gravity, Urine: 1.02 (ref 1.005–1.030)
Squamous Epithelial / HPF: NONE SEEN (ref 0–5)
pH: 6 (ref 5.0–8.0)

## 2021-08-25 MED ORDER — TAMSULOSIN HCL 0.4 MG PO CAPS: 0.4000 mg | ORAL_CAPSULE | Freq: Every day | ORAL | 0 refills | Status: DC

## 2021-08-25 MED ORDER — CELECOXIB 200 MG PO CAPS: 200.0000 mg | ORAL_CAPSULE | Freq: Two times a day (BID) | ORAL | 0 refills | Status: DC

## 2021-08-25 MED ORDER — SULFAMETHOXAZOLE-TRIMETHOPRIM 800-160 MG PO TABS: 1.0000 | ORAL_TABLET | Freq: Two times a day (BID) | ORAL | 0 refills | Status: DC

## 2021-08-25 NOTE — Progress Notes (Signed)
   08/25/2021 11:40 AM   Shawn Henry 1960-12-21 [id number]  Reason for visit: Pelvic pain, dysuria, elevated PSA, history of prostatitis  HPI: 61 year old male who I previously followed for mildly elevated PSA ranging from 3.5-4.5, as well as history of prostatitis and pelvic pain.  When I last saw him in December 2020 he had had prostatitis like urinary symptoms that resolved with a course of Bactrim and anti-inflammatories, and PSA also normalized to 3.2.  PSA has been relatively stable since that time, at 4.56 in October 2022, and 4.82 in April 2022, 3.7 18 December 2019.  He had been doing well until February or March 2023 when he had some recurrence of pelvic pain and dysuria.  This culminated with a fever and chills and presentation to urgent care where he was diagnosed with reported prostatitis, those records or cultures are not available to me.  He was treated with a month of Cipro which improved his symptoms, but he had some recurrence after stopping the antibiotics, and was treated with a 2-week course of doxycycline recently by PCP, that again temporarily improve symptoms but has had recurrence of dysuria and pelvic pain over the last week.  He denies any fevers or chills.  Urinalysis today is completely benign, and PVR is normal at 0 mL.  We discussed possible etiologies at length including pelvic floor dysfunction, prostatitis as well as the differences between bacterial and chronic and nonbacterial prostatitis.  At our previous visit in 2020 he had significant improvement in his similar symptoms after a course of Bactrim and Celebrex.  We also discussed the mildly elevated PSA, but that overall it is essentially been stable over the last few years.  Using shared decision making, he opted for a trial of Bactrim x1 month, Celebrex x10 days, and Flomax x1 month for possible prostatitis.  Return precautions including high fever were discussed extensively.  RTC 6 weeks symptom check,  continue PSA screening, and may need to consider prostate MRI or biopsy in the future if rising  Sondra Come, MD  [facility] [address] [place], [place] [address] [phone]

## 2021-08-25 NOTE — Patient Instructions (Signed)

## 2021-10-06 ENCOUNTER — Ambulatory Visit: Payer: BC Managed Care – PPO | Admitting: Urology

## 2021-10-06 ENCOUNTER — Encounter: Payer: Self-pay | Admitting: Urology

## 2021-10-06 VITALS — BP 132/88 | HR 94 | Ht 72.0 in | Wt 254.0 lb

## 2021-10-06 DIAGNOSIS — R102 Pelvic and perineal pain: Secondary | ICD-10-CM | POA: Diagnosis not present

## 2021-10-06 DIAGNOSIS — N410 Acute prostatitis: Secondary | ICD-10-CM

## 2021-10-06 DIAGNOSIS — Z125 Encounter for screening for malignant neoplasm of prostate: Secondary | ICD-10-CM

## 2021-10-06 NOTE — Patient Instructions (Signed)
Prostatitis  Prostatitis is swelling or inflammation of the prostate gland, also called the prostate. This gland is about 1.5 inches wide and 1 inch high, and it is involved in making semen. The prostate is located below a man's bladder, in front of the rectum. There are four types of prostatitis: Chronic prostatitis (CP), also called chronic pelvic pain syndrome (CPPS). This is the most common type of prostatitis. It is associated with increased muscle tone in the area between the hip bones (pelvic area), around the prostate. This type is also known as a pelvic floor disorder. Chronic bacterial prostatitis. This type usually results from an acute bacterial infection in the prostate gland that keeps coming back or has not been treated properly. The symptoms are less severe than those caused by acute bacterial prostatitis, which lasts a shorter time. Asymptomatic inflammatory prostatitis. This type does not have symptoms and does not need treatment. This is diagnosed when tests are done for other disorders of the urinary tract or reproductive tract. Acute bacterial prostatitis. This type starts quickly and results from an acute bacterial infection in the prostate gland. It is usually associated with a bladder infection, high fever, and chills. This is the least common type of prostatitis. What are the causes? Bacterial prostatitis is caused by an infection from bacteria. Chronic nonbacterial prostatitis may be caused by: Factors related to the nervous system. This system includes thebrain, spinal cord, and nerves. An autoimmune response. This happens when the body's disease-fighting system attacks healthy tissue in the body by mistake. Psychological factors. These have to do with how the mind works. The causes of the other types of prostatitis are usually not known. What are the signs or symptoms? Symptoms of this condition depend on the type of prostatitis you have. Acute bacterial  prostatitis Symptoms may include: Pain or burning during urination. Frequent and sudden urges to urinate. Trouble starting to urinate. Fever. Chills. Pain in your muscles or joints, lower back, or lower abdomen. Other types of prostatitis Symptoms may include: Sudden urges to urinate, or urinating often. Trouble starting to urinate. Weak urine stream. Dribbling after urination. Discharge coming from the penis. Pain in the testicles, the penis, or the tip of the penis. Pain in the area in front of the rectum and below the scrotum (perineum). Pain when ejaculating. How is this diagnosed? This condition may be diagnosed based on: A physical and medical exam. A digital rectal exam. For this, the health care provider may use a finger to feel the prostate. A urine test to check for bacteria. A semen sample or blood tests. Ultrasound. Urodynamic tests to check how your body handles urine. Cystoscopy to look inside your bladder or inside the part of your body that drains urine from the bladder (urethra). How is this treated? Treatment for this condition depends on the type of prostatitis. Treatment may involve: Medicines to relieve pain or inflammation, or to help relax your muscles. Physical therapy. Heat therapy. Biofeedback. These techniques help you control certain body functions. Relaxation exercises. Antibiotic medicine, if your condition is caused by bacteria. Sitz baths. These warm water baths help to relax your pelvic floor muscles, which helps to relieve pressure on the prostate. Follow these instructions at home: Medicines Take over-the-counter and prescription medicines only as told by your health care provider. If you were prescribed an antibiotic medicine, take it as told by your health care provider. Do not stop using the antibiotic even if you start to feel better. Managing pain and swelling    Take sitz baths as directed by your health care provider. For a sitz bath,  sit in warm water that is deep enough to cover your hips and buttocks. If directed, apply heat to the affected area as often as told by your health care provider. Use the heat source that your health care provider recommends, such as a moist heat pack or a heating pad. Place a towel between your skin and the heat source. Leave the heat on for 20-30 minutes. Remove the heat if your skin turns bright red. This is especially important if you are unable to feel pain, heat, or cold. You may have a greater risk of getting burned. General instructions Do exercises as told by your health care provider, if you were prescribed physical therapy, biofeedback, or relaxation exercises. Keep all follow-up visits as told by your health care provider. This is important. Where to find more information National Institute of Diabetes and Digestive and Kidney Diseases: https://www.niddk.nih.gov Contact a health care provider if: Your symptoms get worse. You have a fever. Get help right away if: You have chills. You feel light-headed or feel like you may faint. You cannot urinate. You have blood or blood clots in your urine. Summary Prostatitis is swelling or inflammation of the prostate gland. Treatment for this condition depends on the type of prostatitis. Take over-the-counter and prescription medicines only as told by your health care provider. Get help right away of you have chills, feel light-headed, feel like you may faint, cannot urinate, or have blood or blood clots in your urine. This information is not intended to replace advice given to you by your health care provider. Make sure you discuss any questions you have with your health care provider. Document Revised: 04/06/2019 Document Reviewed: 04/06/2019 Elsevier Patient Education  2023 Elsevier Inc.  Prostate Cancer Screening  Prostate cancer screening is testing that is done to check for the presence of prostate cancer in men. The prostate gland  is a walnut-sized gland that is located below the bladder and in front of the rectum in males. The function of the prostate is to add fluid to semen during ejaculation. Prostate cancer is one of the most common types of cancer in men. Who should have prostate cancer screening? Screening recommendations vary based on age and other risk factors, as well as between the professional organizations who make the recommendations. In general, screening is recommended if: You are age 50 to 70 and have an average risk for prostate cancer. You should talk with your health care provider about your need for screening and how often screening should be done. Because most prostate cancers are slow growing and will not cause death, screening in this age group is generally reserved for men who have a 10- to 15-year life expectancy. You are younger than age 50, and you have these risk factors: Having a father, brother, or uncle who has been diagnosed with prostate cancer. The risk is higher if your family member's cancer occurred at an early age or if you have multiple family members with prostate cancer at an early age. Being a male who is Black or is of Caribbean or sub-Saharan African descent. In general, screening is not recommended if: You are younger than age 40. You are between the ages of 40 and 49 and you have no risk factors. You are 70 years of age or older. At this age, the risks that screening can cause are greater than the benefits that it may provide. If you   are at high risk for prostate cancer, your health care provider may recommend that you have screenings more often or that you start screening at a younger age. How is screening for prostate cancer done? The recommended prostate cancer screening test is a blood test called the prostate-specific antigen (PSA) test. PSA is a protein that is made in the prostate. As you age, your prostate naturally produces more PSA. Abnormally high PSA levels may be caused  by: Prostate cancer. An enlarged prostate that is not caused by cancer (benign prostatic hyperplasia, or BPH). This condition is very common in older men. A prostate gland infection (prostatitis) or urinary tract infection. Certain medicines such as male hormones (like testosterone) or other medicines that raise testosterone levels. A rectal exam may be done as part of prostate cancer screening to help provide information about the size of your prostate gland. When a rectal exam is performed, it should be done after the PSA level is drawn to avoid any effect on the results. Depending on the PSA results, you may need more tests, such as: A physical exam to check the size of your prostate gland, if not done as part of screening. Blood and imaging tests. A procedure to remove tissue samples from your prostate gland for testing (biopsy). This is the only way to know for certain if you have prostate cancer. What are the benefits of prostate cancer screening? Screening can help to identify cancer at an early stage, before symptoms start and when the cancer can be treated more easily. There is a small chance that screening may lower your risk of dying from prostate cancer. The chance is small because prostate cancer is a slow-growing cancer, and most men with prostate cancer die from a different cause. What are the risks of prostate cancer screening? The main risk of prostate cancer screening is diagnosing and treating prostate cancer that would never have caused any symptoms or problems. This is called overdiagnosisand overtreatment. PSA screening cannot tell you if your PSA is high due to cancer or a different cause. A prostate biopsy is the only procedure to diagnose prostate cancer. Even the results of a biopsy may not tell you if your cancer needs to be treated. Slow-growing prostate cancer may not need any treatment other than monitoring, so diagnosing and treating it may cause unnecessary stress or  other side effects. Questions to ask your health care provider When should I start prostate cancer screening? What is my risk for prostate cancer? How often do I need screening? What type of screening tests do I need? How do I get my test results? What do my results mean? Do I need treatment? Where to find more information The American Cancer Society: www.cancer.org American Urological Association: www.auanet.org Contact a health care provider if: You have difficulty urinating. You have pain when you urinate or ejaculate. You have blood in your urine or semen. You have pain in your back or in the area of your prostate. Summary Prostate cancer is a common type of cancer in men. The prostate gland is located below the bladder and in front of the rectum. This gland adds fluid to semen during ejaculation. Prostate cancer screening may identify cancer at an early stage, when the cancer can be treated more easily and is less likely to have spread to other areas of the body. The prostate-specific antigen (PSA) test is the recommended screening test for prostate cancer, but it has associated risks. Discuss the risks and benefits of prostate   cancer screening with your health care provider. If you are age 70 or older, the risks that screening can cause are greater than the benefits that it may provide. This information is not intended to replace advice given to you by your health care provider. Make sure you discuss any questions you have with your health care provider. Document Revised: 08/25/2020 Document Reviewed: 08/25/2020 Elsevier Patient Education  2023 Elsevier Inc.  

## 2021-10-06 NOTE — Progress Notes (Signed)
   10/06/2021 12:34 PM   Shawn Henry January 07, 1961 [id number]  Reason for visit: Pelvic pain, dysuria, elevated PSA, history of prostatitis  HPI: 61 year old male who I have followed for mildly elevated but stable PSA ranging from 3.5-4.5, as well as a history of prostatitis and pelvic pain.  Most recently, I saw him on 08/25/2021 for similar symptoms of pelvic pain and fever/chills concerning for possible prostatitis.  He was actually treated in May 2023 with a few short courses of Cipro and doxycycline and but had recurrence of dysuria and pelvic pain and ended up seeing me.  Urinalysis was benign at that visit, PVR was normal, and we opted for a trial of Bactrim x4 weeks, Celebrex x10 days, and Flomax x1 month.  He reports resolution of his urinary symptoms and pelvic pain after those medications, and symptoms started improving within 1 to 2 weeks.  He denies any complaints today.  We again reviewed the impact of prostatitis on PSA, and the importance of monitoring the PSA long-term.  He is Henry for next PSA in October 2023 with PCP, and I will set a reminder to follow this up.  I recommended continuing yearly follow-up, and we could consider cystoscopy or even prostate MRI or prostate biopsy in the future if recurrence of urinary symptoms, recurrent infections, or PSA continues to rise.  We will follow-up PSA with PCP in October 2023 RTC 1 year symptom check     Sondra Come, MD  [facility] [address] [place], [place] [address] [phone]

## 2021-11-12 IMAGING — MR MR HEAD WO/W CM
13 series · 48 of 48 positions shown · IV contrast (gadavist)
Comparison: 05/13/2014 head CT

CLINICAL DATA: Non intractable headache.

EXAM:
MRI HEAD WITHOUT AND WITH CONTRAST
TECHNIQUE: Multiplanar, multiecho pulse sequences of the brain and surrounding
structures were obtained without and with intravenous contrast.
CONTRAST:  10mL GADAVIST GADOBUTROL 1 MMOL/ML IV SOLN

[Series 5: ax dwi_tracew · axial · 3.0mm · 0.60mm/px · z∈[-62,+92]mm · 3 of 48 slices shown]
[im 1/48]
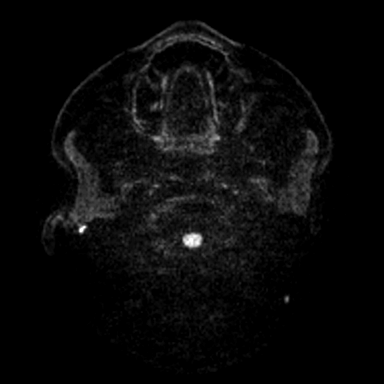
[im 24/48]
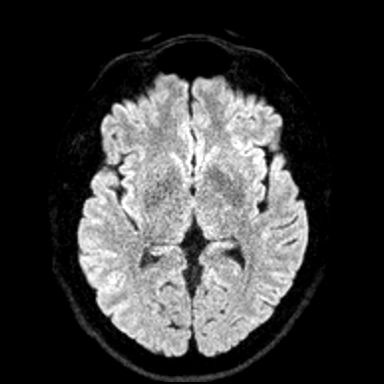
[im 48/48]
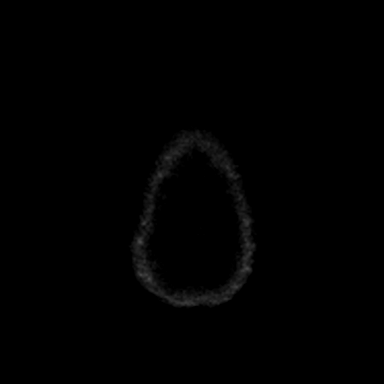

[Series 6: ax dwi_adc · axial · 3.0mm · 0.60mm/px · z∈[-62,+92]mm · 3 of 48 slices shown]
[im 1/48]
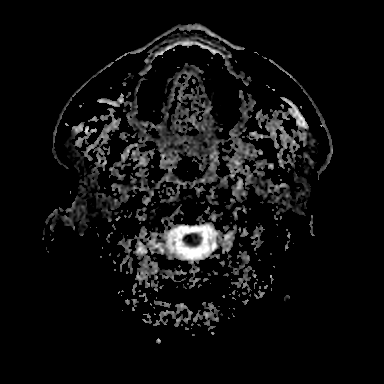
[im 24/48]
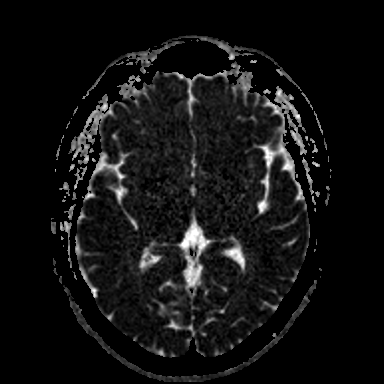
[im 48/48]
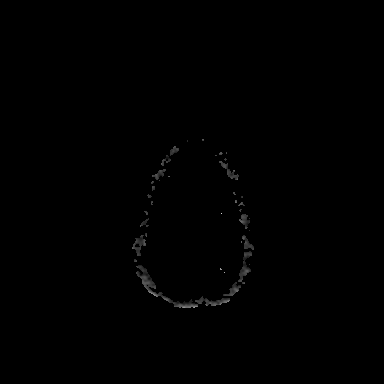

[Series 7: cor dwi_tracew · coronal · 5.0mm · 0.60mm/px · 2 of 40 slices shown]
[im 1/40]
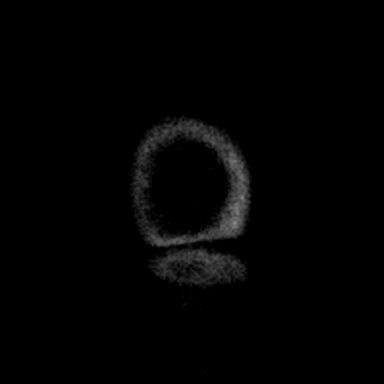
[im 40/40]
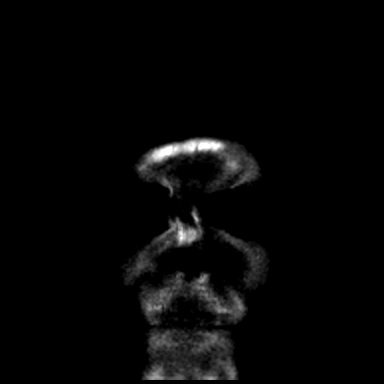

[Series 8: cor dwi_adc · coronal · 5.0mm · 0.60mm/px · 2 of 40 slices shown]
[im 1/40]
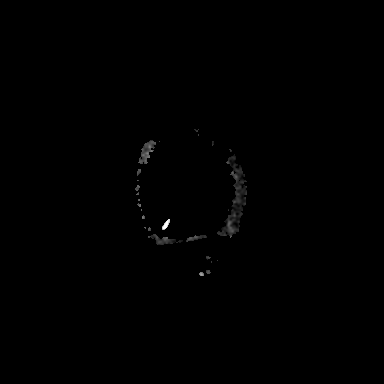
[im 40/40]
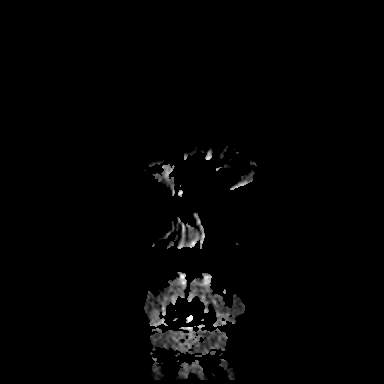

[Series 9: T1 · sagittal · 5.0mm · 0.62mm/px · 1 of 25 slices shown (1 of 2)]
[im 1/25]
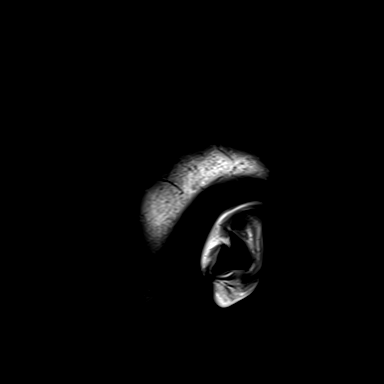

[Series 10: T2 · axial · 5.0mm · 0.53mm/px · z∈[-60,+95]mm · 2 of 27 slices shown]
[im 1/27]
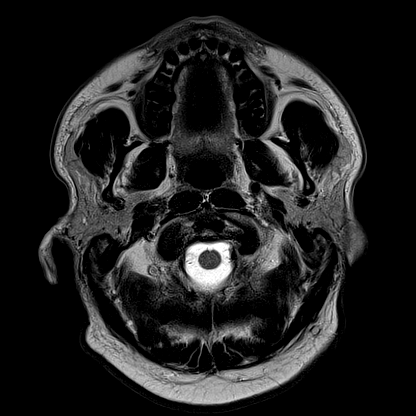
[im 27/27]
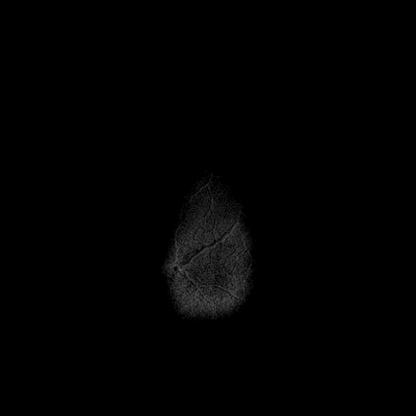

[Series 12: pha_images · axial · 3.0mm · 0.90mm/px · z∈[-70,+106]mm · 4 of 60 slices shown]
[im 1/60]
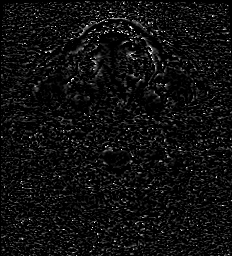
[im 20/60]
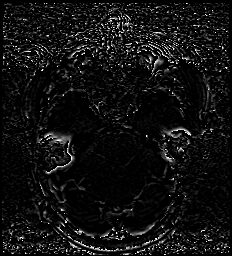
[im 40/60]
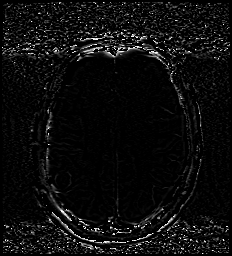
[im 60/60]
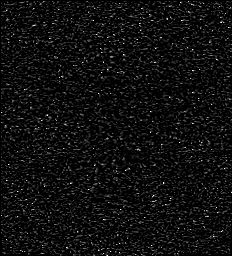

[Series 13: swi_images · axial · 3.0mm · 0.90mm/px · z∈[-70,+106]mm · 4 of 60 slices shown]
[im 1/60]
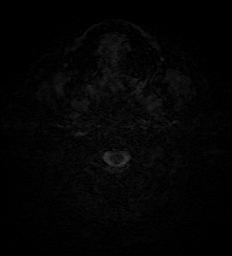
[im 20/60]
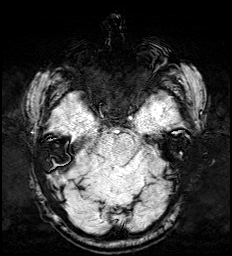
[im 40/60]
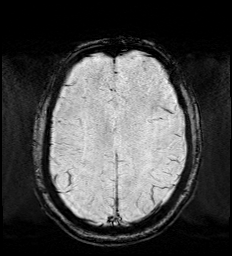
[im 60/60]
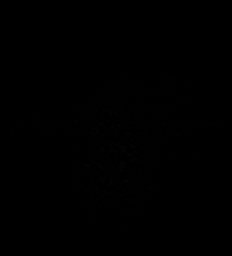

[Series 15: FLAIR · axial · 3.0mm · 0.53mm/px · z∈[-63,+98]mm · 3 of 55 slices shown]
[im 1/55]
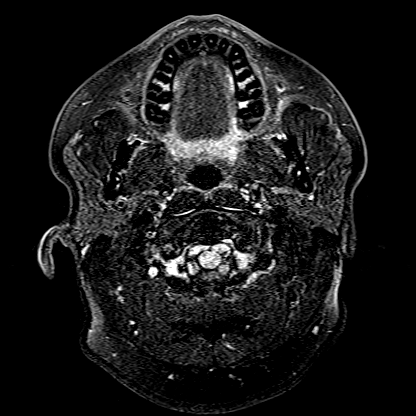
[im 28/55]
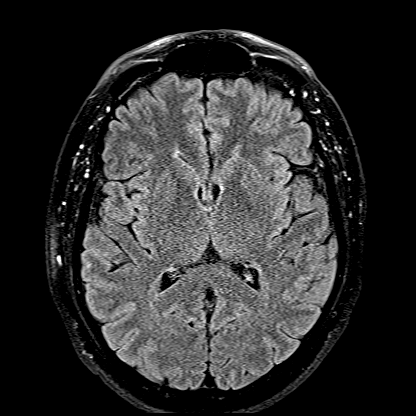
[im 55/55]
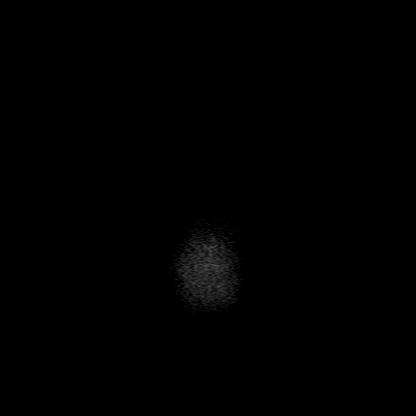

[Series 16: T1 · axial · 1.0mm · 0.98mm/px · z∈[-67,+106]mm · 10 of 176 slices shown (2 of 2)]
[im 1/176]
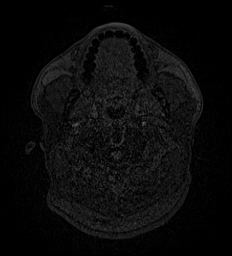
[im 20/176]
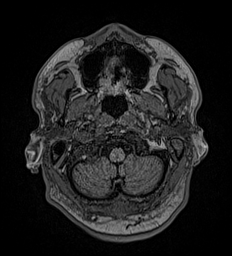
[im 39/176]
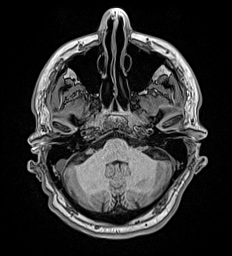
[im 59/176]
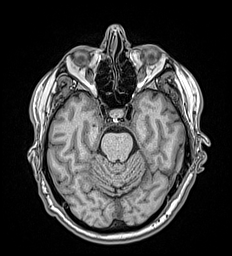
[im 78/176]
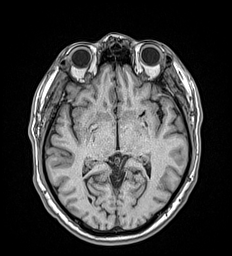
[im 98/176]
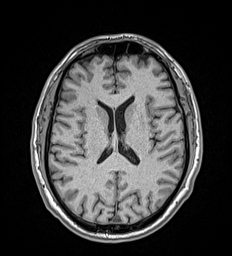
[im 117/176]
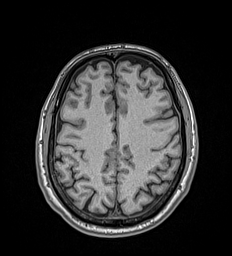
[im 137/176]
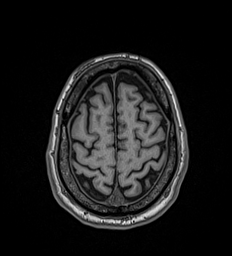
[im 156/176]
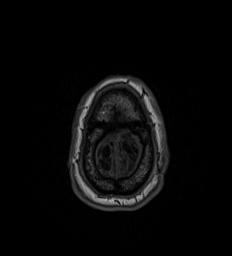
[im 176/176]
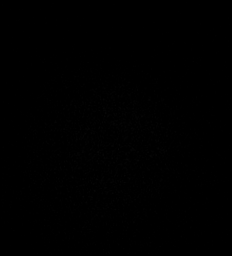

[Series 17: T2 post-contrast · coronal · 5.0mm · 0.57mm/px · 2 of 29 slices shown]
[im 1/29]
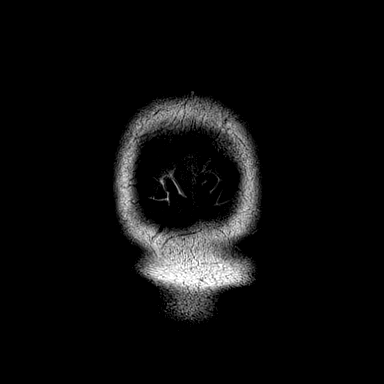
[im 29/29]
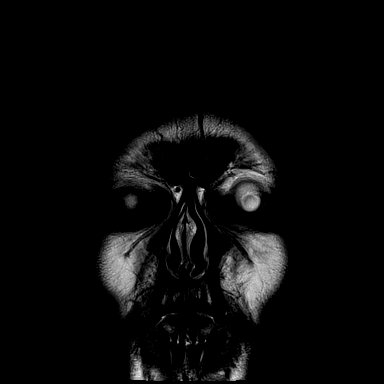

[Series 18: T1 post-contrast · axial · 1.0mm · 0.98mm/px · z∈[-67,+106]mm · 10 of 176 slices shown (1 of 2)]
[im 1/176]
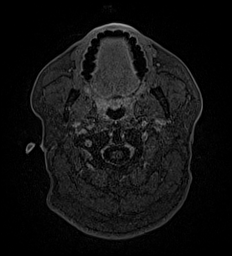
[im 20/176]
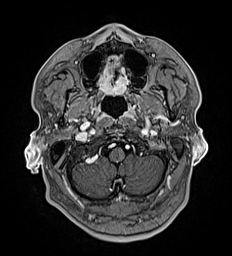
[im 39/176]
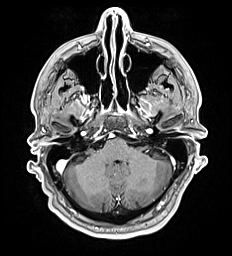
[im 59/176]
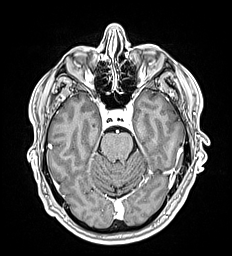
[im 78/176]
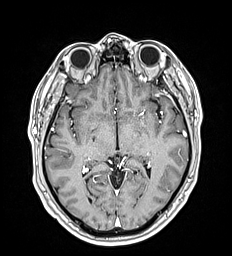
[im 98/176]
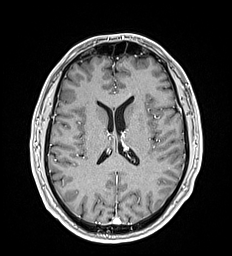
[im 117/176]
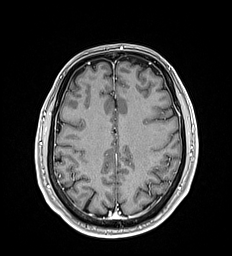
[im 137/176]
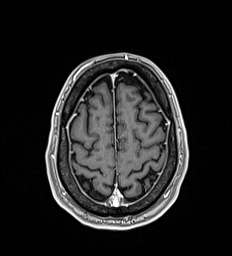
[im 156/176]
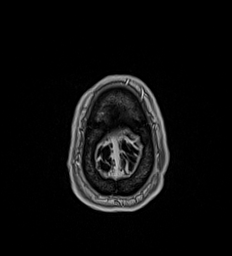
[im 176/176]
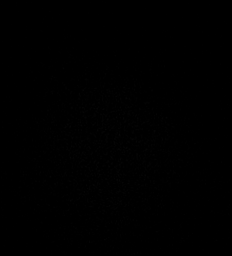

[Series 19: T1 post-contrast · coronal · 5.0mm · 0.57mm/px · 2 of 29 slices shown (2 of 2)]
[im 1/29]
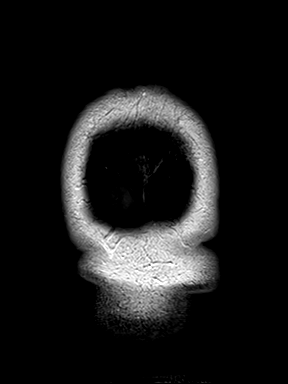
[im 29/29]
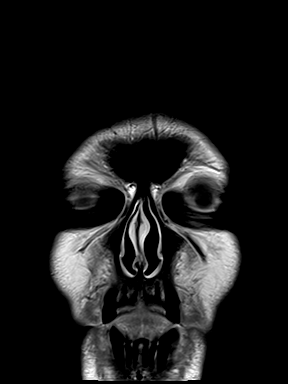

[48 of 48 positions shown; findings below may reference images not displayed]

FINDINGS: Brain: No infarction, hemorrhage, hydrocephalus, extra-axial
collection or mass lesion. No white matter disease or atrophy

Vascular: Normal flow voids and vascular enhancements.

Skull and upper cervical spine: Normal marrow signal

Sinuses/Orbits: Small retention cyst in the left maxillary sinus. No
active sinusitis.
IMPRESSION: Normal MRI of the brain.

## 2022-10-05 ENCOUNTER — Ambulatory Visit: Payer: BC Managed Care – PPO | Admitting: Urology

## 2022-10-05 ENCOUNTER — Encounter: Payer: Self-pay | Admitting: Urology

## 2022-10-05 VITALS — BP 143/87 | HR 86 | Ht 72.0 in | Wt 219.0 lb

## 2022-10-05 DIAGNOSIS — R972 Elevated prostate specific antigen [PSA]: Secondary | ICD-10-CM | POA: Diagnosis not present

## 2022-10-05 DIAGNOSIS — R399 Unspecified symptoms and signs involving the genitourinary system: Secondary | ICD-10-CM

## 2022-10-05 DIAGNOSIS — N529 Male erectile dysfunction, unspecified: Secondary | ICD-10-CM

## 2022-10-05 MED ORDER — TADALAFIL 5 MG PO TABS: 5.0000 mg | ORAL_TABLET | Freq: Every day | ORAL | 11 refills | Status: DC | PRN

## 2022-10-05 NOTE — Progress Notes (Signed)
   10/05/2022 9:23 AM   Shawn Henry Oct 17, 1960 [id number]  Reason for visit: Follow up PSA screening/elevated PSA, history of pelvic pain/prostatitis  HPI: 62 year old male who I have followed for the above issues.  He has a long history of mildly elevated, but stable PSA ranging from 3.5-4.5, as well as a history of prostatitis and pelvic pain.  He denies any episodes of pelvic pain/prostatitis over the last year.  Previously has been treated with 4-week course of Bactrim, 10 days Celebrex, and 1 month of Flomax with improvement in symptoms.  His PSA was checked by PCP in October 2023 and was 4.66 which was relatively stable from prior values.  PCP opted to start finasteride.  He denies any major change in his urinary symptoms since starting the finasteride but PSA has decreased appropriately to 2.37 from April 2024.  He also reports some erectile dysfunction since starting the finasteride.  We discussed a trial of medications, and risk and benefits of Cialis were discussed and he was interested in a trial.  We discussed that finasteride can take up to a year to see symptom improvement, and we discussed the effect of finasteride on decreasing PSA.  Trial of Cialis 5 to 10 mg on demand for ED Continue yearly PSA monitoring, if stable likely changed every other year RTC 1 year PSA prior   Sondra Come, MD  [facility] [address] [place], [place] [address] [phone]

## 2022-11-12 DIAGNOSIS — N529 Male erectile dysfunction, unspecified: Secondary | ICD-10-CM

## 2022-11-12 MED ORDER — TADALAFIL 5 MG PO TABS
5.0000 mg | ORAL_TABLET | Freq: Every day | ORAL | 11 refills | Status: DC | PRN
Start: 2022-11-12 — End: 2023-10-04

## 2023-10-04 ENCOUNTER — Ambulatory Visit: Payer: Self-pay | Admitting: Urology

## 2023-10-04 ENCOUNTER — Other Ambulatory Visit
Admission: RE | Admit: 2023-10-04 | Discharge: 2023-10-04 | Disposition: A | Discharge status: Home / Self Care | Attending: Urology | Admitting: Urology

## 2023-10-04 ENCOUNTER — Encounter: Payer: Self-pay | Admitting: Urology

## 2023-10-04 VITALS — BP 129/85 | HR 73 | Ht 72.0 in | Wt 219.0 lb

## 2023-10-04 DIAGNOSIS — Z125 Encounter for screening for malignant neoplasm of prostate: Secondary | ICD-10-CM | POA: Insufficient documentation

## 2023-10-04 DIAGNOSIS — R102 Pelvic and perineal pain: Secondary | ICD-10-CM | POA: Diagnosis not present

## 2023-10-04 DIAGNOSIS — R972 Elevated prostate specific antigen [PSA]: Secondary | ICD-10-CM | POA: Diagnosis present

## 2023-10-04 DIAGNOSIS — N529 Male erectile dysfunction, unspecified: Secondary | ICD-10-CM | POA: Diagnosis present

## 2023-10-04 LAB — URINALYSIS, COMPLETE (UACMP) WITH MICROSCOPIC
Bilirubin Urine: NEGATIVE
Glucose, UA: NEGATIVE mg/dL
Hgb urine dipstick: NEGATIVE
Ketones, ur: NEGATIVE mg/dL
Leukocytes,Ua: NEGATIVE
Nitrite: NEGATIVE
Protein, ur: NEGATIVE mg/dL
Specific Gravity, Urine: >1.03 — ABNORMAL HIGH (ref 1.005–1.030)
Squamous Epithelial / HPF: NONE SEEN /HPF (ref 0–5)
pH: 5.5 (ref 5.0–8.0)

## 2023-10-04 LAB — PSA: Prostatic Specific Antigen: 2.06 ng/mL (ref 0.00–4.00)

## 2023-10-04 MED ORDER — TADALAFIL 5 MG PO TABS: 5.0000 mg | ORAL_TABLET | Freq: Every day | ORAL | 11 refills | Status: DC | PRN

## 2023-10-04 NOTE — Patient Instructions (Signed)
 SABRA

## 2023-10-04 NOTE — Progress Notes (Signed)
   10/04/2023 8:56 AM   Rakwon CHRISTELLA Bateman October 07, 1960 [id number]  Reason for visit: Follow up PSA screening, history of pelvic pain/prostatitis, ED  HPI: 63 year old male I followed for the above issues, long history of mildly elevated but stable PSAs ranging from 3.5-4.5, as well as history of prostatitis/pelvic pain.  PSA was 4.7 in October 2023 which had been relatively stable and his PCP opted to start finasteride.  Denies any significant urinary changes since starting the finasteride, but PSA decreased appropriately to 2.5.  PSA is pending today.  Risk and benefits of PSA screening discussed, impact of finasteride on PSA screening, he is interested in potentially stopping the finasteride.  Has had a ED since starting finasteride, this is well-managed on Cialis  5 to 10 mg as needed.  He also has had some mild left-sided pelvic discomfort over the last few months of unclear etiology.  Denies any urinary symptoms, no fevers or chills.  Symptoms seem most consistent with pelvic floor dysfunction and stretching exercises provided.  He thinks he may have had some orange tint to his urine, and we will check a UA today.  UA and culture today Pelvic floor stretching exercises provided PSA today and call with results, consider stopping finasteride pending those findings Cialis  refilled Follow-up pending above results, if stable likely 1 year  Redell JAYSON Burnet, MD  [facility] [address], [address] [place], [place] [address] [phone]

## 2023-10-04 NOTE — Addendum Note (Signed)
 Addended by: Beonka Amesquita M on: 10/04/2023 11:55 AM   Modules accepted: Orders

## 2023-10-05 LAB — URINE CULTURE: Culture: NO GROWTH

## 2023-10-05 MED ORDER — FLUCONAZOLE 100 MG PO TABS: 100.0000 mg | ORAL_TABLET | Freq: Every day | ORAL | 0 refills | Status: DC

## 2023-10-05 MED ORDER — FLUCONAZOLE 200 MG PO TABS: 200.0000 mg | ORAL_TABLET | Freq: Once | ORAL | 0 refills | Status: AC

## 2024-01-16 ENCOUNTER — Ambulatory Visit

## 2024-01-16 VITALS — BP 120/86 | HR 97 | Ht 71.0 in | Wt 214.2 lb

## 2024-01-16 DIAGNOSIS — Z87438 Personal history of other diseases of male genital organs: Secondary | ICD-10-CM | POA: Insufficient documentation

## 2024-01-16 DIAGNOSIS — R739 Hyperglycemia, unspecified: Secondary | ICD-10-CM | POA: Diagnosis not present

## 2024-01-16 DIAGNOSIS — Z85828 Personal history of other malignant neoplasm of skin: Secondary | ICD-10-CM | POA: Insufficient documentation

## 2024-01-16 DIAGNOSIS — N529 Male erectile dysfunction, unspecified: Secondary | ICD-10-CM

## 2024-01-16 DIAGNOSIS — E782 Mixed hyperlipidemia: Secondary | ICD-10-CM

## 2024-01-16 DIAGNOSIS — I10 Essential (primary) hypertension: Secondary | ICD-10-CM

## 2024-01-16 DIAGNOSIS — K5790 Diverticulosis of intestine, part unspecified, without perforation or abscess without bleeding: Secondary | ICD-10-CM | POA: Insufficient documentation

## 2024-01-16 DIAGNOSIS — Z23 Encounter for immunization: Secondary | ICD-10-CM

## 2024-01-16 MED ORDER — OLMESARTAN MEDOXOMIL-HCTZ 40-12.5 MG PO TABS: 1.0000 | ORAL_TABLET | Freq: Every day | ORAL | 3 refills | Status: AC

## 2024-01-16 MED ORDER — FINASTERIDE 5 MG PO TABS: 5.0000 mg | ORAL_TABLET | Freq: Once | ORAL | 0 refills | Status: AC

## 2024-01-16 MED ORDER — TADALAFIL 5 MG PO TABS
5.0000 mg | ORAL_TABLET | Freq: Every day | ORAL | 11 refills | Status: AC | PRN
Start: 2024-01-16 — End: ?

## 2024-01-16 NOTE — Progress Notes (Signed)
 New Patient Visit   Physician: Francely Craw A Crystel Demarco, MD  Patient: Shawn Henry   DOB: Jun 09, 1960   63 y.o. Male  MRN: [id number] Visit Date: 01/16/2024   Chief Complaint  Patient presents with   Establish Care   Subjective  Shawn Henry is a 63 y.o. male who presents today as a new patient to establish care.   HPI  Discussed the use of AI scribe software for clinical note transcription with the patient, who gave verbal consent to proceed.  History of Present Illness   Shawn Henry is a 63 year old male who presents for medication refills and routine follow-up. He is accompanied by his wife.  Hypertension and orthostatic symptoms - Hypertension managed with olmesartan, taken once daily in the evening - Occasional dizziness or lightheadedness when bending over  - transient  - BP well controlled, 120/86 today and lower at home  Recurrent prostatitis - History of recurrent prostatitis over the past ten years - Finasteride initiated at age 53, taken once daily in the evening - No episodes of prostatitis since starting finasteride  Diverticulosis and pelvic pain - Diverticulosis diagnosed at age 20 during colonoscopy - Intermittent lower abd discomfort after eating certain foods - Occasional nocturnal pelvic pain, attributed to prolonged immobility  Basal cell carcinoma surveillance - History of basal cell skin cancer - Receives annual dermatologic skin checks  Weight loss and glucose control - Weight reduced from 265 pounds over the past five years by skipping supper and eating breakfast and lunch - Previously borderline diabetic, with improvement in glycemic status following weight lo        ASSESSMENT & PLAN  Encounter Diagnoses  Name Primary?   Elevated blood sugar Yes   Erectile dysfunction, unspecified erectile dysfunction type    Benign essential hypertension    History of chronic prostatitis     No orders of the defined types were placed in this  encounter.   Assessment and Plan    Benign prostatic hyperplasia and recurrent prostatitis Recurrent prostatitis managed with finasteride, no recent infections. Occasional pelvic pain possibly related to diverticulosis. - Continue finasteride. - Refill finasteride prescription.  Hypertension Blood pressure controlled at 120/86 mmHg. Occasional dizziness when bending over, no significant lightheadedness or falls. Continues on olmesartan with good effect. - Continue olmesartan-hydrochlorothiazide 40-12.5 - BP well controlled  - Basic labs end of Jan  Prediabetes Previously advised of borderline diabetes. Weight loss achieved, reducing concern. -4  Order repeat HgbA1C in January o  Basal cell carcinoma Under annual dermatological surveillance   General Health Maintenance Due for influenza and tetanus vaccinations. Engages in regular physical activity and maintains a healthy lifestyle. - Administer influenza vaccine. - Administer tetanus vaccine.             Objective  BP 120/86   Pulse 97   Ht 5' 11 (1.803 m)   Wt 214 lb 3.2 oz (97.2 kg)   SpO2 97%   BMI 29.87 kg/m      Review of Systems  Constitutional:  Negative for chills, fever and weight loss.  Eyes:  Negative for blurred vision. h Respiratory:  Negative for cough and shortness of breath.   Cardiovascular:  Negative for chest pain and palpitations.  Skin:  Negative for rash.  Psychiatric/Behavioral:  Negative for depression. The patient is not nervous/anxious.      Physical Exam Physical Exam Vitals reviewed.  Constitutional:      Appearance: Normal appearance. Well-developed with normal weight.  HENT:     Head: Normocephalic and atraumatic.  Normal mucous membranes, no oral lesions Eyes:     Pupils: Pupils are equal, round, and reactive to light.  Neck:     Thyroid: No thyroid mass or thyromegaly.  Cardiovascular:     Rate and Rhythm: Normal rate and regular rhythm. Normal heart sounds. Normal  peripheral pulses Pulmonary:     Normal breath sounds with normal effort Abdominal:   Abdomen is soft, without tenderness or noted hepatosplenomegaly Musculoskeletal:        General: No swelling or edema  Lymphadenopathy:     Cervical: No cervical adenopathy.  Skin:    General: Skin is warm and dry without noticeable rash. Neurological:     General: No focal deficit present.  Psychiatric:        Mood and Affect: Mood, behavior and cognition normal   Past Medical History:  Diagnosis Date   Gout    HTN (hypertension)    Past Surgical History:  Procedure Laterality Date   FOOT SURGERY  2008   KNEE SURGERY  1980   Family Status  Relation Name Status   PGF  (Not Specified)   Neg Hx  (Not Specified)  No partnership data on file   Family History  Problem Relation Age of Onset   Prostate cancer Paternal Grandfather    Kidney cancer Neg Hx    Kidney failure Neg Hx    Sickle cell anemia Neg Hx    Tuberculosis Neg Hx    Social History   Socioeconomic History   Marital status: Married    Spouse name: Not on file   Number of children: Not on file   Years of education: Not on file   Highest education level: Bachelor's degree (e.g., BA, AB, BS)  Occupational History   Not on file  Tobacco Use   Smoking status: Never    Passive exposure: Never   Smokeless tobacco: Never  Substance and Sexual Activity   Alcohol use: Not on file   Drug use: Not on file   Sexual activity: Not on file  Other Topics Concern   Not on file  Social History Narrative   Not on file   Social Drivers of Health   Financial Resource Strain: Low Risk  (01/11/2024)   Overall Financial Resource Strain (CARDIA)    Difficulty of Paying Living Expenses: Not very hard  Food Insecurity: No Food Insecurity (01/11/2024)   Hunger Vital Sign    Worried About Running Out of Food in the Last Year: Never true    Ran Out of Food in the Last Year: Never true  Transportation Needs: No Transportation Needs  (01/11/2024)   PRAPARE - Administrator, Civil Service (Medical): No    Lack of Transportation (Non-Medical): No  Physical Activity: Insufficiently Active (01/11/2024)   Exercise Vital Sign    Days of Exercise per Week: 3 days    Minutes of Exercise per Session: 20 min  Stress: No Stress Concern Present (01/11/2024)   [employer] of Occupational Health - Occupational Stress Questionnaire    Feeling of Stress: Not at all  Social Connections: Socially Integrated (01/11/2024)   Social Connection and Isolation Panel    Frequency of Communication with Friends and Family: More than three times a week    Frequency of Social Gatherings with Friends and Family: More than three times a week    Attends Religious Services: More than 4 times per year    Active  Member of Clubs or Organizations: Yes    Attends Banker Meetings: Not on file    Marital Status: Married   Outpatient Medications Prior to Visit  Medication Sig   aspirin EC 81 MG tablet Take 81 mg by mouth daily.   finasteride (PROSCAR) 5 MG tablet Take 5 mg by mouth.   olmesartan-hydrochlorothiazide (BENICAR HCT) 40-12.5 MG tablet Take 1 tablet by mouth daily.   tadalafil  (CIALIS ) 5 MG tablet Take 1-2 tablets (5-10 mg total) by mouth daily as needed for erectile dysfunction.   fluconazole  (DIFLUCAN ) 100 MG tablet Take 1 tablet (100 mg total) by mouth daily.   [DISCONTINUED] meloxicam  (MOBIC ) 15 MG tablet Take 1 tablet (15 mg total) by mouth daily.   [DISCONTINUED] Multiple Vitamin (MULTI-VITAMINS) TABS Take by mouth.   [DISCONTINUED] OVER THE COUNTER MEDICATION Gout Out   No facility-administered medications prior to visit.   Allergies  Allergen Reactions   Penicillin G Potassium In D5w Rash    Immunization History  Administered Date(s) Administered   Influenza Inj Mdck Quad Pf 12/15/2017, 01/05/2021, 01/11/2022   Influenza-Unspecified 12/09/2016, 01/11/2023   Pneumococcal Polysaccharide-23  05/22/2013   Tdap 05/22/2013   Zoster Recombinant(Shingrix) 11/02/2017, 11/08/2017, 01/20/2018, 01/24/2018    Health Maintenance  Topic Date Due   COVID-19 Vaccine (1) Never done   HIV Screening  Never done   Hepatitis C Screening  Never done   Colonoscopy  Never done   Pneumococcal Vaccine: 50+ Years (2 of 2 - PCV) 05/23/2014   DTaP/Tdap/Td (2 - Td or Tdap) 05/23/2023   Influenza Vaccine  10/14/2023   Zoster Vaccines- Shingrix  Completed   Hepatitis B Vaccines 19-59 Average Risk  Aged Out   HPV VACCINES  Aged Out   Meningococcal B Vaccine  Aged Out    Patient Care Team: Diedra Lame, MD as PCP - General (Family Medicine)  Depression Screen     No data to display           Parris DELENA Juneau, MD  [facility] [phone] (phone) [phone] (fax)  [facility]

## 2024-01-16 NOTE — Addendum Note (Signed)
 Addended by: BLINDA DOUGLAS on: 01/16/2024 09:03 AM   Modules accepted: Orders

## 2024-01-17 ENCOUNTER — Other Ambulatory Visit: Payer: Self-pay

## 2024-01-17 DIAGNOSIS — Z87438 Personal history of other diseases of male genital organs: Secondary | ICD-10-CM

## 2024-01-17 MED ORDER — FINASTERIDE 5 MG PO TABS: 5.0000 mg | ORAL_TABLET | Freq: Every day | ORAL | 3 refills | Status: AC

## 2024-04-02 ENCOUNTER — Other Ambulatory Visit

## 2024-04-02 DIAGNOSIS — E782 Mixed hyperlipidemia: Secondary | ICD-10-CM

## 2024-04-02 DIAGNOSIS — I10 Essential (primary) hypertension: Secondary | ICD-10-CM

## 2024-04-02 DIAGNOSIS — Z87438 Personal history of other diseases of male genital organs: Secondary | ICD-10-CM

## 2024-04-02 DIAGNOSIS — N529 Male erectile dysfunction, unspecified: Secondary | ICD-10-CM

## 2024-04-02 DIAGNOSIS — K5790 Diverticulosis of intestine, part unspecified, without perforation or abscess without bleeding: Secondary | ICD-10-CM

## 2024-04-02 DIAGNOSIS — Z85828 Personal history of other malignant neoplasm of skin: Secondary | ICD-10-CM

## 2024-04-02 DIAGNOSIS — R739 Hyperglycemia, unspecified: Secondary | ICD-10-CM

## 2024-04-03 LAB — LIPID PANEL
Cholesterol: 197 mg/dL
HDL: 53 mg/dL
LDL Cholesterol (Calc): 123 mg/dL — ABNORMAL HIGH
Non-HDL Cholesterol (Calc): 144 mg/dL — ABNORMAL HIGH
Total CHOL/HDL Ratio: 3.7 (calc)
Triglycerides: 100 mg/dL

## 2024-04-03 LAB — COMPREHENSIVE METABOLIC PANEL WITH GFR
AG Ratio: 1.9 (calc) (ref 1.0–2.5)
ALT: 18 U/L (ref 9–46)
AST: 13 U/L (ref 10–35)
Albumin: 4.4 g/dL (ref 3.6–5.1)
Alkaline phosphatase (APISO): 47 U/L (ref 35–144)
BUN: 12 mg/dL (ref 7–25)
CO2: 30 mmol/L (ref 20–32)
Calcium: 9.5 mg/dL (ref 8.6–10.3)
Chloride: 105 mmol/L (ref 98–110)
Creat: 0.89 mg/dL (ref 0.70–1.35)
Globulin: 2.3 g/dL (ref 1.9–3.7)
Glucose, Bld: 102 mg/dL — ABNORMAL HIGH (ref 65–99)
Potassium: 4.2 mmol/L (ref 3.5–5.3)
Sodium: 141 mmol/L (ref 135–146)
Total Bilirubin: 0.6 mg/dL (ref 0.2–1.2)
Total Protein: 6.7 g/dL (ref 6.1–8.1)
eGFR: 96 mL/min/1.73m2

## 2024-04-03 LAB — CBC WITH DIFFERENTIAL/PLATELET
Absolute Lymphocytes: 1596 {cells}/uL (ref 850–3900)
Absolute Monocytes: 448 {cells}/uL (ref 200–950)
Basophils Absolute: 50 {cells}/uL (ref 0–200)
Basophils Relative: 0.9 %
Eosinophils Absolute: 62 {cells}/uL (ref 15–500)
Eosinophils Relative: 1.1 %
HCT: 47.2 % (ref 39.4–51.1)
Hemoglobin: 15 g/dL (ref 13.2–17.1)
MCH: 27.4 pg (ref 27.0–33.0)
MCHC: 31.8 g/dL (ref 31.6–35.4)
MCV: 86.1 fL (ref 81.4–101.7)
MPV: 11 fL (ref 7.5–12.5)
Monocytes Relative: 8 %
Neutro Abs: 3444 {cells}/uL (ref 1500–7800)
Neutrophils Relative %: 61.5 %
Platelets: 249 Thousand/uL (ref 140–400)
RBC: 5.48 Million/uL (ref 4.20–5.80)
RDW: 13.3 % (ref 11.0–15.0)
Total Lymphocyte: 28.5 %
WBC: 5.6 Thousand/uL (ref 3.8–10.8)

## 2024-04-03 LAB — URINALYSIS, ROUTINE W REFLEX MICROSCOPIC
Bilirubin Urine: NEGATIVE
Glucose, UA: NEGATIVE
Hgb urine dipstick: NEGATIVE
Ketones, ur: NEGATIVE
Leukocytes,Ua: NEGATIVE
Nitrite: NEGATIVE
Protein, ur: NEGATIVE
Specific Gravity, Urine: 1.023 (ref 1.001–1.035)
pH: 7.5 (ref 5.0–8.0)

## 2024-04-03 LAB — HEMOGLOBIN A1C
Hgb A1c MFr Bld: 5.6 %
Mean Plasma Glucose: 114 mg/dL
eAG (mmol/L): 6.3 mmol/L

## 2024-04-09 ENCOUNTER — Ambulatory Visit

## 2024-04-17 ENCOUNTER — Ambulatory Visit

## 2024-04-24 ENCOUNTER — Ambulatory Visit: Admitting: Family Medicine
